# Patient Record
Sex: Female | Born: 1970 | Race: White | Hispanic: No | Marital: Married | Smoking: Former smoker
Health system: Southern US, Community
[De-identification: ages and names within clinical notes are randomized; demographics above are authoritative.]

---

## 2006-03-07 ENCOUNTER — Emergency Department (HOSPITAL_COMMUNITY): Admission: EM | Admit: 2006-03-07 | Discharge: 2006-03-07 | Payer: Self-pay | Admitting: Emergency Medicine

## 2006-08-09 ENCOUNTER — Emergency Department (HOSPITAL_COMMUNITY): Admission: EM | Admit: 2006-08-09 | Discharge: 2006-08-09 | Payer: Self-pay | Admitting: Family Medicine

## 2006-08-19 ENCOUNTER — Emergency Department (HOSPITAL_COMMUNITY): Admission: EM | Admit: 2006-08-19 | Discharge: 2006-08-19 | Payer: Self-pay | Admitting: Family Medicine

## 2008-01-16 ENCOUNTER — Emergency Department (HOSPITAL_COMMUNITY): Admission: EM | Admit: 2008-01-16 | Discharge: 2008-01-16 | Payer: Self-pay | Admitting: Emergency Medicine

## 2008-01-30 HISTORY — PX: CHOLECYSTECTOMY: SHX55

## 2008-04-29 ENCOUNTER — Emergency Department (HOSPITAL_COMMUNITY): Admission: EM | Admit: 2008-04-29 | Discharge: 2008-04-30 | Payer: Self-pay | Admitting: Emergency Medicine

## 2009-01-17 ENCOUNTER — Ambulatory Visit (HOSPITAL_COMMUNITY): Admission: RE | Admit: 2009-01-17 | Discharge: 2009-01-17 | Payer: Self-pay | Admitting: General Surgery

## 2010-05-01 LAB — CBC
HCT: 42.5 % (ref 36.0–46.0)
Hemoglobin: 14.4 g/dL (ref 12.0–15.0)
MCV: 93.3 fL (ref 78.0–100.0)
Platelets: 182 10*3/uL (ref 150–400)
RBC: 4.56 MIL/uL (ref 3.87–5.11)
RDW: 13 % (ref 11.5–15.5)

## 2010-05-10 LAB — URINALYSIS, ROUTINE W REFLEX MICROSCOPIC
Glucose, UA: NEGATIVE mg/dL
Hgb urine dipstick: NEGATIVE
Specific Gravity, Urine: 1.005 (ref 1.005–1.030)
Urobilinogen, UA: 0.2 mg/dL (ref 0.0–1.0)

## 2010-05-10 LAB — CBC
Hemoglobin: 14.9 g/dL (ref 12.0–15.0)
MCHC: 34.6 g/dL (ref 30.0–36.0)
MCV: 90.6 fL (ref 78.0–100.0)
Platelets: 171 10*3/uL (ref 150–400)
RDW: 13.4 % (ref 11.5–15.5)

## 2010-05-10 LAB — URINE MICROSCOPIC-ADD ON

## 2010-05-10 LAB — DIFFERENTIAL
Basophils Relative: 1 % (ref 0–1)
Lymphs Abs: 2.9 10*3/uL (ref 0.7–4.0)
Monocytes Relative: 7 % (ref 3–12)
Neutrophils Relative %: 55 % (ref 43–77)

## 2010-05-10 LAB — COMPREHENSIVE METABOLIC PANEL
BUN: 5 mg/dL — ABNORMAL LOW (ref 6–23)
Calcium: 9.1 mg/dL (ref 8.4–10.5)
Chloride: 105 mEq/L (ref 96–112)
Glucose, Bld: 73 mg/dL (ref 70–99)
Potassium: 4.1 mEq/L (ref 3.5–5.1)

## 2010-05-10 LAB — POCT PREGNANCY, URINE: Preg Test, Ur: NEGATIVE

## 2010-05-10 LAB — LIPASE, BLOOD: Lipase: 33 U/L (ref 11–59)

## 2010-06-28 ENCOUNTER — Other Ambulatory Visit: Payer: Self-pay | Admitting: Obstetrics and Gynecology

## 2010-11-03 LAB — CBC
HCT: 42 % (ref 36.0–46.0)
Platelets: 159 10*3/uL (ref 150–400)
RBC: 4.56 MIL/uL (ref 3.87–5.11)
WBC: 10.7 10*3/uL — ABNORMAL HIGH (ref 4.0–10.5)

## 2010-11-03 LAB — DIFFERENTIAL
Basophils Absolute: 0 10*3/uL (ref 0.0–0.1)
Basophils Relative: 0 % (ref 0–1)
Eosinophils Absolute: 0.2 10*3/uL (ref 0.0–0.7)
Lymphs Abs: 1.9 10*3/uL (ref 0.7–4.0)
Monocytes Relative: 5 % (ref 3–12)
Neutro Abs: 7.2 10*3/uL (ref 1.7–7.7)

## 2010-11-03 LAB — COMPREHENSIVE METABOLIC PANEL
ALT: 39 U/L — ABNORMAL HIGH (ref 0–35)
AST: 27 U/L (ref 0–37)
Alkaline Phosphatase: 71 U/L (ref 39–117)
CO2: 25 mEq/L (ref 19–32)
Sodium: 138 mEq/L (ref 135–145)

## 2010-11-03 LAB — URINALYSIS, ROUTINE W REFLEX MICROSCOPIC
Bilirubin Urine: NEGATIVE
Glucose, UA: NEGATIVE mg/dL
Hgb urine dipstick: NEGATIVE
Protein, ur: NEGATIVE mg/dL
Specific Gravity, Urine: 1.027 (ref 1.005–1.030)
Urobilinogen, UA: 0.2 mg/dL (ref 0.0–1.0)
pH: 6 (ref 5.0–8.0)

## 2012-05-20 ENCOUNTER — Encounter: Payer: Self-pay | Admitting: Gynecology

## 2012-06-10 ENCOUNTER — Encounter: Payer: Self-pay | Admitting: Gynecology

## 2012-06-17 ENCOUNTER — Telehealth: Payer: Self-pay | Admitting: Gynecology

## 2012-06-17 NOTE — Telephone Encounter (Signed)
Left message on CB home # of need to keep appt. Tomorrow with Dr. Farrel Gobble.

## 2012-06-17 NOTE — Telephone Encounter (Signed)
Patient would like to speak to nurse to find out if she will have any complications with her pap smear tomorrow during appointment after having unexpectedly began her menstrual cycle.

## 2012-06-18 ENCOUNTER — Ambulatory Visit (INDEPENDENT_AMBULATORY_CARE_PROVIDER_SITE_OTHER): Payer: 59 | Admitting: Gynecology

## 2012-06-18 ENCOUNTER — Encounter: Payer: Self-pay | Admitting: Gynecology

## 2012-06-18 VITALS — BP 104/66 | Ht 63.0 in | Wt 167.0 lb

## 2012-06-18 DIAGNOSIS — F17200 Nicotine dependence, unspecified, uncomplicated: Secondary | ICD-10-CM | POA: Insufficient documentation

## 2012-06-18 DIAGNOSIS — Z309 Encounter for contraceptive management, unspecified: Secondary | ICD-10-CM

## 2012-06-18 DIAGNOSIS — Z01419 Encounter for gynecological examination (general) (routine) without abnormal findings: Secondary | ICD-10-CM

## 2012-06-18 DIAGNOSIS — Z124 Encounter for screening for malignant neoplasm of cervix: Secondary | ICD-10-CM

## 2012-06-18 MED ORDER — MISOPROSTOL 200 MCG PO TABS: ORAL_TABLET | ORAL | Status: DC

## 2012-06-18 NOTE — Progress Notes (Signed)
42 y.o.   Married    Caucasian   female [id number]   here for annual exam.  Pt is not using contraception but is not interested in conception.  Had used ocp in past but d/c'd due to smoking. Pt denies dyspareunia.  Pt interested in quitting smoking tried cold [place] without success and was nauseated from vaporized nicotine  LMP: 06/17/12         Sexually active: yes  The current method of family planning is none.    Exercising: none Last mammogram:  2012 Last pap smear: 2012 History of abnormal pap: Age 84/20 - had a colposcopy been normal eversince Smoking: 1 pak/qd Alcohol: no Last colonoscopy: none Last Bone Density:  none Last tetanus shot: within 10 years Last cholesterol check: 05/21/12-  Normal BSE: yes  Hgb: PCP     Urine: PCP   No family history on file.  There are no active problems to display for this patient.   No past medical history on file.  No past surgical history on file.  Allergies: Review of patient's allergies indicates not on file.  No current outpatient prescriptions on file.   No current facility-administered medications for this visit.    ROS: Pertinent items are noted in HPI.  Social Hx:    Exam:    There were no vitals taken for this visit.   Wt Readings from Last 3 Encounters:  No data found for Wt     Ht Readings from Last 3 Encounters:  No data found for Ht    General appearance: alert, cooperative and appears stated age Head: Normocephalic, without obvious abnormality, atraumatic Neck: no adenopathy, supple, symmetrical, trachea midline and thyroid not enlarged, symmetric, no tenderness/mass/nodules Lungs: clear to auscultation bilaterally Breasts: Inspection negative, No nipple retraction or dimpling, No nipple discharge or bleeding, No axillary or supraclavicular adenopathy, Normal to palpation without dominant masses Heart: regular rate and rhythm Abdomen: soft, non-tender; bowel sounds normal; no masses,  no  organomegaly Extremities: extremities normal, atraumatic, no cyanosis or edema Skin: Skin color, texture, turgor normal. No rashes or lesions Lymph nodes: Cervical, supraclavicular, and axillary nodes normal. No abnormal inguinal nodes palpated Neurologic: Grossly normal   Pelvic: External genitalia:  no lesions              Urethra:  normal appearing urethra with no masses, tenderness or lesions              Bartholins and Skenes: normal                 Vagina: normal appearing vagina with normal color, no lesions, menstrum              Cervix: normal appearance, menstrum noted              Pap taken: yes        Bimanual Exam:  Uterus:  uterus is normal size, shape, consistency and nontender                                      Adnexa: normal adnexa in size, nontender and no masses                                      Rectovaginal: Confirms  Anus:  normal sphincter tone, no lesions  A: Annual exam-well women Contraceptive management smoker     P:discussed contraceptive options-IUD's nexpanon Depo-pt would like to have Mirena placed, instructed regarding pretreatment with cytotec  mammogram qyear pap smear guideline reviewed Long discussion regarding smoking cessation techniques, pt fearful regarding weight gain, discussed vaporized making her ill might be a preferable exchange as she doesn't like it, patches and other oral substitutes reviewed-ie gum, straws, etc return annually or prn     An After Visit Summary was printed and given to the patient.

## 2012-06-18 NOTE — Patient Instructions (Signed)
Levonorgestrel intrauterine device (IUD) What is this medicine? LEVONORGESTREL IUD (LEE voe nor jes trel) is a contraceptive (birth control) device. It is used to prevent pregnancy and to treat heavy bleeding that occurs during your period. It can be used for up to 5 years. This medicine may be used for other purposes; ask your health care provider or pharmacist if you have questions. What should I tell my health care provider before I take this medicine? They need to know if you have any of these conditions: -abnormal Pap smear -cancer of the breast, uterus, or cervix -diabetes -endometritis -genital or pelvic infection now or in the past -have more than one sexual partner or your partner has more than one partner -heart disease -history of an ectopic or tubal pregnancy -immune system problems -IUD in place -liver disease or tumor -problems with blood clots or take blood-thinners -use intravenous drugs -uterus of unusual shape -vaginal bleeding that has not been explained -an unusual or allergic reaction to levonorgestrel, other hormones, silicone, or polyethylene, medicines, foods, dyes, or preservatives -pregnant or trying to get pregnant -breast-feeding How should I use this medicine? This device is placed inside the uterus by a health care professional. Talk to your pediatrician regarding the use of this medicine in children. Special care may be needed. Overdosage: If you think you have taken too much of this medicine contact a poison control center or emergency room at once. NOTE: This medicine is only for you. Do not share this medicine with others. What if I miss a dose? This does not apply. What may interact with this medicine? Do not take this medicine with any of the following medications: -amprenavir -bosentan -fosamprenavir This medicine may also interact with the following medications: -aprepitant -barbiturate medicines for inducing sleep or treating  seizures -bexarotene -griseofulvin -medicines to treat seizures like carbamazepine, ethotoin, felbamate, oxcarbazepine, phenytoin, topiramate -modafinil -pioglitazone -rifabutin -rifampin -rifapentine -some medicines to treat HIV infection like atazanavir, indinavir, lopinavir, nelfinavir, tipranavir, ritonavir -St. John's wort -warfarin This list may not describe all possible interactions. Give your health care provider a list of all the medicines, herbs, non-prescription drugs, or dietary supplements you use. Also tell them if you smoke, drink alcohol, or use illegal drugs. Some items may interact with your medicine. What should I watch for while using this medicine? Visit your doctor or health care professional for regular check ups. See your doctor if you or your partner has sexual contact with others, becomes HIV positive, or gets a sexual transmitted disease. This product does not protect you against HIV infection (AIDS) or other sexually transmitted diseases. You can check the placement of the IUD yourself by reaching up to the top of your vagina with clean fingers to feel the threads. Do not pull on the threads. It is a good habit to check placement after each menstrual period. Call your doctor right away if you feel more of the IUD than just the threads or if you cannot feel the threads at all. The IUD may come out by itself. You may become pregnant if the device comes out. If you notice that the IUD has come out use a backup birth control method like condoms and call your health care provider. Using tampons will not change the position of the IUD and are okay to use during your period. What side effects may I notice from receiving this medicine? Side effects that you should report to your doctor or health care professional as soon as possible: -allergic reactions   like skin rash, itching or hives, swelling of the face, lips, or tongue -fever, flu-like symptoms -genital sores -high  blood pressure -no menstrual period for 6 weeks during use -pain, swelling, warmth in the leg -pelvic pain or tenderness -severe or sudden headache -signs of pregnancy -stomach cramping -sudden shortness of breath -trouble with balance, talking, or walking -unusual vaginal bleeding, discharge -yellowing of the eyes or skin Side effects that usually do not require medical attention (report to your doctor or health care professional if they continue or are bothersome): -acne -breast pain -change in sex drive or performance -changes in weight -cramping, dizziness, or faintness while the device is being inserted -headache -irregular menstrual bleeding within first 3 to 6 months of use -nausea This list may not describe all possible side effects. Call your doctor for medical advice about side effects. You may report side effects to FDA at 1-800-FDA-1088. Where should I keep my medicine? This does not apply. NOTE: This sheet is a summary. It may not cover all possible information. If you have questions about this medicine, talk to your doctor, pharmacist, or health care provider.  2012, Elsevier/Gold Standard. (02/06/2008 6:39:08 PM)Smoking Cessation Quitting smoking is important to your health and has many advantages. However, it is not always easy to quit since nicotine is a very addictive drug. Often times, people try 3 times or more before being able to quit. This document explains the best ways for you to prepare to quit smoking. Quitting takes hard work and a lot of effort, but you can do it. ADVANTAGES OF QUITTING SMOKING  You will live longer, feel better, and live better.  Your body will feel the impact of quitting smoking almost immediately.  Within 20 minutes, blood pressure decreases. Your pulse returns to its normal level.  After 8 hours, carbon monoxide levels in the blood return to normal. Your oxygen level increases.  After 24 hours, the chance of having a heart attack  starts to decrease. Your breath, hair, and body stop smelling like smoke.  After 48 hours, damaged nerve endings begin to recover. Your sense of taste and smell improve.  After 72 hours, the body is virtually free of nicotine. Your bronchial tubes relax and breathing becomes easier.  After 2 to 12 weeks, lungs can hold more air. Exercise becomes easier and circulation improves.  The risk of having a heart attack, stroke, cancer, or lung disease is greatly reduced.  After 1 year, the risk of coronary heart disease is cut in half.  After 5 years, the risk of stroke falls to the same as a nonsmoker.  After 10 years, the risk of lung cancer is cut in half and the risk of other cancers decreases significantly.  After 15 years, the risk of coronary heart disease drops, usually to the level of a nonsmoker.  If you are pregnant, quitting smoking will improve your chances of having a healthy baby.  The people you live with, especially any children, will be healthier.  You will have extra money to spend on things other than cigarettes. QUESTIONS TO THINK ABOUT BEFORE ATTEMPTING TO QUIT You may want to talk about your answers with your caregiver.  Why do you want to quit?  If you tried to quit in the past, what helped and what did not?  What will be the most difficult situations for you after you quit? How will you plan to handle them?  Who can help you through the tough times? Your family? Friends? A  caregiver?  What pleasures do you get from smoking? What ways can you still get pleasure if you quit? Here are some questions to ask your caregiver:  How can you help me to be successful at quitting?  What medicine do you think would be best for me and how should I take it?  What should I do if I need more help?  What is smoking withdrawal like? How can I get information on withdrawal? GET READY  Set a quit date.  Change your environment by getting rid of all cigarettes, ashtrays,  matches, and lighters in your home, car, or work. Do not let people smoke in your home.  Review your past attempts to quit. Think about what worked and what did not. GET SUPPORT AND ENCOURAGEMENT You have a better chance of being successful if you have help. You can get support in many ways.  Tell your family, friends, and co-workers that you are going to quit and need their support. Ask them not to smoke around you.  Get individual, group, or telephone counseling and support. Programs are available at [employer] and health centers. Call your local health department for information about programs in your area.  Spiritual beliefs and practices may help some smokers quit.  Download a "quit meter" on your computer to keep track of quit statistics, such as how long you have gone without smoking, cigarettes not smoked, and money saved.  Get a self-help book about quitting smoking and staying off of tobacco. LEARN NEW SKILLS AND BEHAVIORS  Distract yourself from urges to smoke. Talk to someone, go for a walk, or occupy your time with a task.  Change your normal routine. Take a different route to work. Drink tea instead of coffee. Eat breakfast in a different place.  Reduce your stress. Take a hot bath, exercise, or read a book.  Plan something enjoyable to do every day. Reward yourself for not smoking.  Explore interactive web-based programs that specialize in helping you quit. GET MEDICINE AND USE IT CORRECTLY Medicines can help you stop smoking and decrease the urge to smoke. Combining medicine with the above behavioral methods and support can greatly increase your chances of successfully quitting smoking.  Nicotine replacement therapy helps deliver nicotine to your body without the negative effects and risks of smoking. Nicotine replacement therapy includes nicotine gum, lozenges, inhalers, nasal sprays, and skin patches. Some may be available over-the-counter and others require a  prescription.  Antidepressant medicine helps people abstain from smoking, but how this works is unknown. This medicine is available by prescription.  Nicotinic receptor partial agonist medicine simulates the effect of nicotine in your brain. This medicine is available by prescription. Ask your caregiver for advice about which medicines to use and how to use them based on your health history. Your caregiver will tell you what side effects to look out for if you choose to be on a medicine or therapy. Carefully read the information on the package. Do not use any other product containing nicotine while using a nicotine replacement product.  RELAPSE OR DIFFICULT SITUATIONS Most relapses occur within the first 3 months after quitting. Do not be discouraged if you start smoking again. Remember, most people try several times before finally quitting. You may have symptoms of withdrawal because your body is used to nicotine. You may crave cigarettes, be irritable, feel very hungry, cough often, get headaches, or have difficulty concentrating. The withdrawal symptoms are only temporary. They are strongest when you first quit,  but they will go away within 10 14 days. To reduce the chances of relapse, try to:  Avoid drinking alcohol. Drinking lowers your chances of successfully quitting.  Reduce the amount of caffeine you consume. Once you quit smoking, the amount of caffeine in your body increases and can give you symptoms, such as a rapid heartbeat, sweating, and anxiety.  Avoid smokers because they can make you want to smoke.  Do not let weight gain distract you. Many smokers will gain weight when they quit, usually less than 10 pounds. Eat a healthy diet and stay active. You can always lose the weight gained after you quit.  Find ways to improve your mood other than smoking. FOR MORE INFORMATION  www.smokefree.gov  Document Released: 01/09/2001 Document Revised: 07/17/2011 Document Reviewed:  04/26/2011 [facility] Patient Information 2014 [place], [place].

## 2012-06-19 ENCOUNTER — Telehealth: Payer: Self-pay | Admitting: Gynecology

## 2012-06-19 NOTE — Telephone Encounter (Signed)
LVM advising 100% covered and to call back during the first 5 days of her cycle.

## 2012-06-20 ENCOUNTER — Ambulatory Visit (INDEPENDENT_AMBULATORY_CARE_PROVIDER_SITE_OTHER): Payer: 59 | Admitting: Gynecology

## 2012-06-20 ENCOUNTER — Other Ambulatory Visit: Payer: Self-pay

## 2012-06-20 VITALS — BP 124/70 | HR 74 | Ht 63.0 in | Wt 166.6 lb

## 2012-06-20 DIAGNOSIS — Z1231 Encounter for screening mammogram for malignant neoplasm of breast: Secondary | ICD-10-CM

## 2012-06-20 DIAGNOSIS — Z309 Encounter for contraceptive management, unspecified: Secondary | ICD-10-CM

## 2012-06-20 LAB — IPS PAP TEST WITH HPV

## 2012-06-20 NOTE — Patient Instructions (Signed)
Intrauterine Device Insertion Care After Refer to this sheet in the next few weeks. These instructions provide you with information on caring for yourself after your procedure. Your caregiver may also give you more specific instructions. Your treatment has been planned according to current medical practices, but problems sometimes occur. Call your caregiver if you have any problems or questions after your procedure. HOME CARE INSTRUCTIONS   Only take over-the-counter or prescription medicines for pain, discomfort, or fever as directed by your caregiver. Do not use aspirin. This may increase bleeding.  Check your IUD to make sure it is in place before you resume sexual activity. You should be able to feel the strings. If you cannot feel the strings, something may be wrong. The IUD may have fallen out of the uterus, or the uterus may have been punctured (perforated) during placement. Also, if the strings are getting longer, it may mean that the IUD is being forced out of the uterus. You no longer have full protection from pregnancy if any of these problems occur.  You may resume sexual intercourse if you are not having problems with the IUD. The IUD is considered immediately effective.  You may resume normal activities.  Keep all follow-up appointments to be sure your IUD has remained in place. After the first exam, yearly exams are advised, unless you cannot feel the strings of your IUD.  Continue to check that the IUD is still in place by feeling for the strings after every menstrual period. SEEK MEDICAL CARE IF:   You have bleeding that is heavier or lasts longer than a normal menstrual cycle.  You have a fever.  You have increasing cramps or abdominal pain not relieved with medicine.  You have abdominal pain that does not seem to be related to the same area of earlier cramping and pain.  You are lightheaded, unusually weak, or faint.  You have abnormal vaginal discharge or  smells.  You have pain during sexual intercourse.  You cannot feel the IUD strings, or the IUD string has gotten longer.  You feel the IUD at the opening of the cervix in the vagina.  You think you are pregnant, or you miss your menstrual period.  The IUD string is hurting your sex partner. Document Released: 09/13/2010 Document Revised: 04/09/2011 Document Reviewed: 09/13/2010 [employer] Patient Information 2014 [employer].  

## 2012-06-20 NOTE — Telephone Encounter (Signed)
Patient is ready to schedule IUD appointment. Patient says she was told she could get this done today.

## 2012-06-20 NOTE — Telephone Encounter (Signed)
Spoke with pt about appt. Per TL, pt can come at 11:00 for Mirena insertion. Instructed pt to take 800 mg ibuprofen with food now. Pt agreeable.

## 2012-06-20 NOTE — Procedures (Signed)
38 yrsMarried Caucasian female presents for  insertion of Mirena. Denies any vaginal symptoms or STD concerns.  [facility]  Patient read information regarding IUD insertion.  All questions addressed.    Healthy female,time, place and personnormal menses, no abnormal bleeding, pelvic pain or discharge, no breast pain or new or enlarging lumps on self exam Abdomen: soft, non-tender Gro[place] inguinal nodes palpated  Pelvic exam: Vulva;normal female genitalia  Vagina:normal vagina  Cervix:Non-tender, Negative CMT, no lesions or redness, nulliparous/parous os  Uterus:normal shape, position and consistency, anteverted    Procedure:  Speculum inserted into vagina. Cervix visualized and cleansed with betadine solution X 3. Tenaculum placed on cervix at 12 o'clock position(s).  Uterus sounded to 7 centimeters.  IUD removed from sterile packet and under sterile conditions inserted to fundus of uterus.  Introducer removed without difficulty.  IUD string trimmed to 3 centimeters.  Remainder string given to patient to feel for identification.  Tenaculum removed.  No bleeding noted.  Speculum removed.  Uterus palpated normal.  Patient tolerated procedure well.  A: Insertion of Mirena, Lot # [id number], Expiration date 12/15   P:  Instructions and warnings signs given.       IUD identification card given with IUD removal 05/2017       Return visit 1 MONTH

## 2012-06-24 ENCOUNTER — Ambulatory Visit: Payer: Self-pay | Admitting: Gynecology

## 2012-06-25 ENCOUNTER — Ambulatory Visit
Admission: RE | Admit: 2012-06-25 | Discharge: 2012-06-25 | Disposition: A | Discharge status: Home / Self Care | Payer: 59 | Source: Ambulatory Visit

## 2012-06-25 DIAGNOSIS — Z1231 Encounter for screening mammogram for malignant neoplasm of breast: Secondary | ICD-10-CM

## 2012-10-05 ENCOUNTER — Ambulatory Visit: Payer: 59 | Admitting: Family Medicine

## 2012-10-05 VITALS — BP 110/64 | HR 74 | Temp 98.1°F | Resp 16 | Ht 63.0 in | Wt 166.6 lb

## 2012-10-05 DIAGNOSIS — R05 Cough: Secondary | ICD-10-CM

## 2012-10-05 DIAGNOSIS — J209 Acute bronchitis, unspecified: Secondary | ICD-10-CM

## 2012-10-05 MED ORDER — AZITHROMYCIN 250 MG PO TABS: ORAL_TABLET | ORAL | Status: DC

## 2012-10-05 MED ORDER — FLUCONAZOLE 150 MG PO TABS: 150.0000 mg | ORAL_TABLET | Freq: Once | ORAL | Status: DC

## 2012-10-05 MED ORDER — HYDROCODONE-HOMATROPINE 5-1.5 MG/5ML PO SYRP: 5.0000 mL | ORAL_SOLUTION | Freq: Three times a day (TID) | ORAL | Status: DC | PRN

## 2012-10-05 NOTE — Progress Notes (Signed)
[facility] [address] [place] [address] [phone]  Date:  10/05/2012   Name:  Carmen Chaney   DOB:  09/04/1970   MRN:  [id number]  PCP:  Leonel Ramsay, MD    Chief Complaint: Nasal Congestion, Sinus pressure, Chills and Cough   History of Present Illness:  Carmen Chaney is a 42 y.o. very pleasant female patient who presents with the following:  Here today with illness.  She notes sinus and chest congestion for about one week.  The sx started with chest burning and feeling tight.  The cough is productive.  She is blowing out a lot of nasal mucus as well.  She is most bothered by her chest congestion and cough.  Some sneezing, mostly cough.   She did have a temp of 101.3 a few days ago, none more recently.   No GI symptoms.   She is generally healthy She has an IUD, no menses.   Her step- daughter was recently ill with similar sx.   She has not taken any meds today except for cough drops   Patient Active Problem List   Diagnosis Date Noted  . Smoker 06/18/2012    History reviewed. No pertinent past medical history.  Past Surgical History  Procedure Laterality Date  . Cholecystectomy  2010    History  Substance Use Topics  . Smoking status: Current Every Day Smoker -- 1.00 packs/day for 15 years    Types: Cigarettes  . Smokeless tobacco: Not on file  . Alcohol Use: No    Family History  Problem Relation Age of Onset  . Breast cancer Mother   . Hypertension Mother   . Prostate cancer Father   . Hypertension Brother   . Hypertension Maternal Grandmother   . Hypertension Maternal Grandfather   . Breast cancer Paternal Grandmother   . Diabetes Paternal Grandmother     No Known Allergies  Medication list has been reviewed and updated.  Current Outpatient Prescriptions on File Prior to Visit  Medication Sig Dispense Refill  . Cetirizine-Pseudoephedrine (ZYRTEC-D PO) Take by mouth.      . misoprostol (CYTOTEC) 200 MCG tablet 1 po  evening before procedure and morning of  2 tablet  0   No current facility-administered medications on file prior to visit.    Review of Systems:  As per HPI- otherwise negative.   Physical Examination: Filed Vitals:   10/05/12 0944  BP: 110/64  Pulse: 74  Temp: 98.1 F (36.7 C)  Resp: 16   Filed Vitals:   10/05/12 0944  Height: 5\' 3"  (1.6 m)  Weight: 166 lb 9.6 oz (75.569 kg)   Body mass index is 29.52 kg/(m^2). Ideal Body Weight: Weight in (lb) to have BMI = 25: 140.8  GEN: WDWN, NAD, Non-toxic, A & O x 3, overweight HEENT: Atraumatic, Normocephalic. Neck supple. No masses, No LAD.  Bilateral TM wnl, oropharynx normal.  PEERL,EOMI.   Nasal congestion, appears congested  Ears and Nose: No external deformity. CV: RRR, No M/G/R. No JVD. No thrill. No extra heart sounds. PULM: CTA B, no wheezes, crackles, rhonchi. No retractions. No resp. distress. No accessory muscle use. EXTR: No c/c/e NEURO Normal gait.  PSYCH: Normally interactive. Conversant. Not depressed or anxious appearing.  Calm demeanor.    Assessment and Plan: Acute bronchitis - Plan: azithromycin (ZITHROMAX Z-PAK) 250 MG tablet, fluconazole (DIFLUCAN) 150 MG tablet  Cough - Plan: HYDROcodone-homatropine (HYCODAN) 5-1.5 MG/5ML syrup   Given  note for tonight and tomorrow night for her job.  Had to hand write due to printer problem.   Azithromycin, diflucan is needed for yeast vaginitis.  Hycodan as needed but cautioned regarding sedation.   See patient instructions for more details.     Signed Abbe Amsterdam, MD

## 2012-10-05 NOTE — Patient Instructions (Addendum)
Use the azithromycin as directed, and the cough syrup as needed. Let me know if you are not better in the next few days- Sooner if worse.

## 2013-01-25 ENCOUNTER — Ambulatory Visit: Payer: Self-pay | Admitting: Family Medicine

## 2013-01-25 ENCOUNTER — Emergency Department (HOSPITAL_COMMUNITY)
Admission: EM | Admit: 2013-01-25 | Discharge: 2013-01-25 | Disposition: A | Discharge status: Home / Self Care | Payer: 59 | Attending: Emergency Medicine | Admitting: Emergency Medicine

## 2013-01-25 ENCOUNTER — Emergency Department (HOSPITAL_COMMUNITY): Payer: 59

## 2013-01-25 ENCOUNTER — Other Ambulatory Visit: Payer: Self-pay

## 2013-01-25 ENCOUNTER — Ambulatory Visit: Payer: Self-pay

## 2013-01-25 ENCOUNTER — Encounter (HOSPITAL_COMMUNITY): Payer: Self-pay | Admitting: Emergency Medicine

## 2013-01-25 VITALS — BP 102/82 | HR 70 | Temp 98.0°F | Resp 18 | Wt 172.0 lb

## 2013-01-25 DIAGNOSIS — R0789 Other chest pain: Secondary | ICD-10-CM

## 2013-01-25 DIAGNOSIS — R05 Cough: Secondary | ICD-10-CM

## 2013-01-25 DIAGNOSIS — R0602 Shortness of breath: Secondary | ICD-10-CM

## 2013-01-25 DIAGNOSIS — J209 Acute bronchitis, unspecified: Secondary | ICD-10-CM | POA: Insufficient documentation

## 2013-01-25 DIAGNOSIS — Z3202 Encounter for pregnancy test, result negative: Secondary | ICD-10-CM | POA: Insufficient documentation

## 2013-01-25 DIAGNOSIS — Z792 Long term (current) use of antibiotics: Secondary | ICD-10-CM | POA: Insufficient documentation

## 2013-01-25 DIAGNOSIS — J40 Bronchitis, not specified as acute or chronic: Secondary | ICD-10-CM

## 2013-01-25 DIAGNOSIS — Z87891 Personal history of nicotine dependence: Secondary | ICD-10-CM | POA: Insufficient documentation

## 2013-01-25 DIAGNOSIS — IMO0002 Reserved for concepts with insufficient information to code with codable children: Secondary | ICD-10-CM | POA: Insufficient documentation

## 2013-01-25 LAB — CBC
HCT: 42.5 % (ref 36.0–46.0)
Hemoglobin: 14.6 g/dL (ref 12.0–15.0)
MCHC: 34.4 g/dL (ref 30.0–36.0)
RBC: 4.64 MIL/uL (ref 3.87–5.11)
WBC: 5.8 10*3/uL (ref 4.0–10.5)

## 2013-01-25 LAB — URINALYSIS, ROUTINE W REFLEX MICROSCOPIC
Bilirubin Urine: NEGATIVE
Glucose, UA: 250 mg/dL — AB
Ketones, ur: 15 mg/dL — AB
Leukocytes, UA: NEGATIVE
Nitrite: NEGATIVE
Specific Gravity, Urine: 1.028 (ref 1.005–1.030)
Urobilinogen, UA: 0.2 mg/dL (ref 0.0–1.0)
pH: 7 (ref 5.0–8.0)

## 2013-01-25 LAB — BASIC METABOLIC PANEL
BUN: 8 mg/dL (ref 6–23)
CO2: 21 mEq/L (ref 19–32)
Chloride: 104 mEq/L (ref 96–112)
GFR calc non Af Amer: 72 mL/min — ABNORMAL LOW (ref >90)
Glucose, Bld: 203 mg/dL — ABNORMAL HIGH (ref 70–99)
Potassium: 3.4 mEq/L — ABNORMAL LOW (ref 3.5–5.1)
Sodium: 139 mEq/L (ref 135–145)

## 2013-01-25 LAB — PREGNANCY, URINE: Preg Test, Ur: NEGATIVE

## 2013-01-25 LAB — POCT CBC
Hemoglobin: 13.5 g/dL (ref 12.2–16.2)
MCH, POC: 29.9 pg (ref 27–31.2)
MCV: 96.8 fL (ref 80–97)
POC Granulocyte: 3.3 (ref 2–6.9)
POC MID %: 8.4 %M (ref 0–12)
Platelet Count, POC: 146 10*3/uL (ref 142–424)
WBC: 7.1 10*3/uL (ref 4.6–10.2)

## 2013-01-25 LAB — POCT I-STAT TROPONIN I
Troponin i, poc: 0 ng/mL (ref 0.00–0.08)
Troponin i, poc: 0 ng/mL (ref 0.00–0.08)

## 2013-01-25 MED ORDER — IPRATROPIUM BROMIDE 0.02 % IN SOLN
0.5000 mg | Freq: Once | RESPIRATORY_TRACT | Status: AC
  Administered 2013-01-25: 0.5 mg via RESPIRATORY_TRACT
  Filled 2013-01-25: qty 2.5

## 2013-01-25 MED ORDER — METHYLPREDNISOLONE SODIUM SUCC 125 MG IJ SOLR
125.0000 mg | Freq: Once | INTRAMUSCULAR | Status: AC
  Administered 2013-01-25: 125 mg via INTRAMUSCULAR

## 2013-01-25 MED ORDER — SODIUM CHLORIDE 0.9 % IV SOLN
INTRAVENOUS | Status: DC
  Administered 2013-01-25: 19:00:00 via INTRAVENOUS

## 2013-01-25 MED ORDER — IPRATROPIUM BROMIDE 0.02 % IN SOLN
0.5000 mg | Freq: Once | RESPIRATORY_TRACT | Status: AC
  Administered 2013-01-25: 0.5 mg via RESPIRATORY_TRACT

## 2013-01-25 MED ORDER — PREDNISONE 10 MG PO TABS: ORAL_TABLET | ORAL | Status: DC

## 2013-01-25 MED ORDER — ALBUTEROL SULFATE HFA 108 (90 BASE) MCG/ACT IN AERS
2.0000 | INHALATION_SPRAY | RESPIRATORY_TRACT | Status: AC
  Administered 2013-01-25: 2 via RESPIRATORY_TRACT
  Filled 2013-01-25: qty 6.7

## 2013-01-25 MED ORDER — ALBUTEROL SULFATE (5 MG/ML) 0.5% IN NEBU
5.0000 mg | INHALATION_SOLUTION | Freq: Once | RESPIRATORY_TRACT | Status: AC
  Administered 2013-01-25: 5 mg via RESPIRATORY_TRACT
  Filled 2013-01-25: qty 1

## 2013-01-25 MED ORDER — MORPHINE SULFATE 4 MG/ML IJ SOLN
4.0000 mg | INTRAMUSCULAR | Status: DC | PRN
  Administered 2013-01-25: 4 mg via INTRAVENOUS
  Filled 2013-01-25: qty 1

## 2013-01-25 MED ORDER — HYDROCODONE-HOMATROPINE 5-1.5 MG/5ML PO SYRP: 5.0000 mL | ORAL_SOLUTION | Freq: Three times a day (TID) | ORAL | Status: DC | PRN

## 2013-01-25 MED ORDER — ALBUTEROL SULFATE (2.5 MG/3ML) 0.083% IN NEBU
2.5000 mg | INHALATION_SOLUTION | Freq: Once | RESPIRATORY_TRACT | Status: AC
  Administered 2013-01-25: 2.5 mg via RESPIRATORY_TRACT

## 2013-01-25 MED ORDER — IOHEXOL 350 MG/ML SOLN
80.0000 mL | Freq: Once | INTRAVENOUS | Status: AC | PRN
  Administered 2013-01-25: 67 mL via INTRAVENOUS

## 2013-01-25 MED ORDER — DIPHENHYDRAMINE HCL 50 MG/ML IJ SOLN
25.0000 mg | Freq: Once | INTRAMUSCULAR | Status: AC
  Administered 2013-01-25: 25 mg via INTRAVENOUS
  Filled 2013-01-25: qty 1

## 2013-01-25 MED ORDER — HYDROCODONE-ACETAMINOPHEN 5-325 MG PO TABS: ORAL_TABLET | ORAL | Status: DC

## 2013-01-25 MED ORDER — AZITHROMYCIN 250 MG PO TABS: ORAL_TABLET | ORAL | Status: DC

## 2013-01-25 NOTE — ED Provider Notes (Signed)
CSN: [id number]     Arrival date & time 01/25/13  1551 History   First MD Initiated Contact with Patient 01/25/13 1808     Chief Complaint  Patient presents with  . Chest Pain  . Shortness of Breath    HPI Pt was seen at 1815. Per pt, c/o gradual onset and persistence of constant cough and SOB for the past 2 days. States the SOB worsens on exertion. Has been associated with generalized chest "tightness" which radiates into her left shoulder since yesterday. Pt states she was dx with "the flu" approx 1 week ago and is taking tamiflu. States her previous symptoms of runny/stuffy nose and sinus congestion have resolved, but she continues to have cough and sneezing. States she "tried an inhaler" without relief of her SOB and chest tightness. States she was evaluated at an [facility] today, dx "bronchitis," rx z-pack and given IM solumedrol. States she was also given a neb treatment without relief of her symptoms. Pt was then sent to the ED "to make sure I didn't have a blood clot in my lungs." Denies sore throat, no fevers, no palpitations, no abd pain, no N/V/D, no rash.       History reviewed. No pertinent past medical history.    Past Surgical History  Procedure Laterality Date  . Cholecystectomy  2010   Family History  Problem Relation Age of Onset  . Breast cancer Mother   . Hypertension Mother   . Prostate cancer Father   . Hypertension Brother   . Hypertension Maternal Grandmother   . Hypertension Maternal Grandfather   . Breast cancer Paternal Grandmother   . Diabetes Paternal Grandmother    History  Substance Use Topics  . Smoking status: Former Smoker -- 1.00 packs/day for 15 years    Types: Cigarettes  . Smokeless tobacco: Not on file  . Alcohol Use: No   OB History   Grav Para Term Preterm Abortions TAB SAB Ect Mult Living   2  0  2  2   0     Review of Systems ROS: Statement: All systems negative except as marked or noted in the HPI; Constitutional: Negative for fever  and chills. ; ; Eyes: Negative for eye pain, redness and discharge. ; ; ENMT: +sneezing. Negative for ear pain, hoarseness, nasal congestion, sinus pressure and sore throat. ; ; Cardiovascular: +CP, SOB. Negative for palpitations, diaphoresis, and peripheral edema. ; ; Respiratory: +cough. Negative for wheezing and stridor. ; ; Gastrointestinal: Negative for nausea, vomiting, diarrhea, abdominal pain, blood in stool, hematemesis, jaundice and rectal bleeding. . ; ; Genitourinary: Negative for dysuria, flank pain and hematuria. ; ; Musculoskeletal: Negative for back pain and neck pain. Negative for swelling and trauma.; ; Skin: Negative for pruritus, rash, abrasions, blisters, bruising and skin lesion.; ; Neuro: Negative for headache, lightheadedness and neck stiffness. Negative for weakness, altered level of consciousness , altered mental status, extremity weakness, paresthesias, involuntary movement, seizure and syncope.      Allergies  Review of patient's allergies indicates no known allergies.  Home Medications   Current Outpatient Rx  Name  Route  Sig  Dispense  Refill  . azithromycin (ZITHROMAX Z-PAK) 250 MG tablet      Use as a zpack   6 each   0   . Cetirizine-Pseudoephedrine (ZYRTEC-D PO)   Oral   Take by mouth.         Marland Kitchen HYDROcodone-homatropine (HYCODAN) 5-1.5 MG/5ML syrup   Oral   Take 5  mLs by mouth every 8 (eight) hours as needed for cough.   90 mL   0   . oseltamivir (TAMIFLU) 75 MG capsule   Oral   Take 75 mg by mouth.         . predniSONE (DELTASONE) 10 MG tablet      Take 6 tabs po d1, 5 tabs po d2, 4 tabs po d3, 3 tabs po d4, 2 tabs po d5, 1 tab po d6.   21 tablet   0    BP 141/83  Pulse 79  Temp(Src) 97.3 F (36.3 C) (Oral)  Resp 14  Ht 5\' 3"  (1.6 m)  Wt 165 lb (74.844 kg)  BMI 29.24 kg/m2  SpO2 97% Physical Exam 1820: Physical examination:  Nursing notes reviewed; Vital signs and O2 SAT reviewed;  Constitutional: Well developed, Well nourished,  Well hydrated, In no acute distress; Head:  Normocephalic, atraumatic; Eyes: EOMI, PERRL, No scleral icterus; ENMT: TM's clear bilat. +edemetous nasal turbinates bilat with clear rhinorrhea. Mouth and pharynx normal, Mucous membranes moist; Neck: Supple, Full range of motion, No lymphadenopathy; Cardiovascular: Tachycardic rate and rhythm, No gallop; Respiratory: Breath sounds diminished & equal bilaterally, No wheezes. No coughing. Speaking full sentences with ease, Normal respiratory effort/excursion; Chest: Nontender, Movement normal; Abdomen: Soft, Nontender, Nondistended, Normal bowel sounds; Genitourinary: No CVA tenderness; Extremities: Pulses normal, No tenderness, No edema, No calf edema or asymmetry.; Neuro: AA&Ox3, Major CN grossly intact.  Speech clear. No gross focal motor or sensory deficits in extremities.; Skin: Color normal, Warm, Dry.; Psych:  Affect flat, poor eye contact.     ED Course  Procedures     EKG Interpretation    Date/Time:  Sunday January 25 2013 16:02:50 EST Ventricular Rate:  113 PR Interval:  130 QRS Duration: 68 QT Interval:  308 QTC Calculation: 422 R Axis:   90 Text Interpretation:  Sinus tachycardia Rightward axis Nonspecific ST and T wave abnormality Abnormal ECG When compared with ECG of 03/07/2006 Nonspecific ST and T wave abnormality is now Present Confirmed by [facility]  MD, Nicholos Johns [phone]) on 01/25/2013 6:12:32 PM            MDM  MDM Reviewed: previous chart, nursing note and vitals Reviewed previous: labs and ECG Interpretation: labs, ECG, x-ray and CT scan    Date: 01/25/2013 repeat EKG  Rate: 78  Rhythm: normal sinus rhythm, baseline wander  QRS Axis: normal  Intervals: normal  ST/T Wave abnormalities: nonspecific ST/T changes  Conduction Disutrbances:none  Narrative Interpretation:   Old EKG Reviewed: unchanged; no significant changes from previous EKG 03/07/2006.  Results for orders placed during the hospital encounter of  01/25/13  CBC      Result Value Range   WBC 5.8  4.0 - 10.5 K/uL   RBC 4.64  3.87 - 5.11 MIL/uL   Hemoglobin 14.6  12.0 - 15.0 g/dL   HCT 95.6  21.3 - 08.6 %   MCV 91.6  78.0 - 100.0 fL   MCH 31.5  26.0 - 34.0 pg   MCHC 34.4  30.0 - 36.0 g/dL   RDW 57.8  46.9 - 62.9 %   Platelets 160  150 - 400 K/uL  BASIC METABOLIC PANEL      Result Value Range   Sodium 139  135 - 145 mEq/L   Potassium 3.4 (*) 3.5 - 5.1 mEq/L   Chloride 104  96 - 112 mEq/L   CO2 21  19 - 32 mEq/L   Glucose, Bld 203 (*)  70 - 99 mg/dL   BUN 8  6 - 23 mg/dL   Creatinine, Ser 1.61  0.50 - 1.10 mg/dL   Calcium 9.2  8.4 - 09.6 mg/dL   GFR calc non Af Amer 72 (*) >90 mL/min   GFR calc Af Amer 83 (*) >90 mL/min  PRO B NATRIURETIC PEPTIDE      Result Value Range   Pro B Natriuretic peptide (BNP) 16.3  0 - 125 pg/mL  URINALYSIS, ROUTINE W REFLEX MICROSCOPIC      Result Value Range   Color, Urine YELLOW  YELLOW   APPearance CLEAR  CLEAR   Specific Gravity, Urine 1.028  1.005 - 1.030   pH 7.0  5.0 - 8.0   Glucose, UA 250 (*) NEGATIVE mg/dL   Hgb urine dipstick NEGATIVE  NEGATIVE   Bilirubin Urine NEGATIVE  NEGATIVE   Ketones, ur 15 (*) NEGATIVE mg/dL   Protein, ur NEGATIVE  NEGATIVE mg/dL   Urobilinogen, UA 0.2  0.0 - 1.0 mg/dL   Nitrite NEGATIVE  NEGATIVE   Leukocytes, UA NEGATIVE  NEGATIVE  PREGNANCY, URINE      Result Value Range   Preg Test, Ur NEGATIVE  NEGATIVE  POCT I-STAT TROPONIN I      Result Value Range   Troponin i, poc 0.00  0.00 - 0.08 ng/mL   Comment 3           POCT I-STAT TROPONIN I      Result Value Range   Troponin i, poc 0.00  0.00 - 0.08 ng/mL   Comment 3            Dg Chest 2 View 01/25/2013   CLINICAL DATA:  Low grade fever.  Short of breath.  Chest tightness  EXAM: CHEST  2 VIEW  COMPARISON:  01/25/2013 at 7:43 a.m.  FINDINGS: The heart size and mediastinal contours are within normal limits. Both lungs are clear. The visualized skeletal structures are unremarkable.  IMPRESSION:  Normal chest radiographs   Electronically Signed   By: Amie Portland M.D.   On: 01/25/2013 16:45    Ct Angio Chest Pe W/cm &/or Wo Cm 01/25/2013   CLINICAL DATA:  One day history of shortness of breath, mid chest pain and tightness, radiating into the left shoulder.  EXAM: CT ANGIOGRAPHY CHEST WITH CONTRAST  TECHNIQUE: Multidetector CT imaging of the chest was performed using the standard protocol during bolus administration of intravenous contrast. Multiplanar CT image reconstructions including MIPs were obtained to evaluate the vascular anatomy.  CONTRAST:  67mL OMNIPAQUE IOHEXOL 350 MG/ML IV.  COMPARISON:  None.  FINDINGS: Contrast opacification of the pulmonary arteries is good. No filling defects within either main pulmonary artery or their branches in either lung to suggest pulmonary embolism. Heart size normal. No visible coronary atherosclerosis. No visible atherosclerosis involving the thoracic or upper abdominal aorta or their visualized branches.  Low lung volumes accounting for atelectasis in the lower lobes. Lungs otherwise clear without localized airspace consolidation, interstitial disease, or parenchymal nodules or masses. No pleural effusions. Central airways patent without significant bronchial wall thickening.  No significant mediastinal, hilar, or axillary lymphadenopathy. Visualized thyroid gland unremarkable.  Approximate 1.8 x 2.2 cm likely complex sebaceous cyst in the subcutaneous fat of the upper back at the approximate T1 level just to the right of midline. Bone window images demonstrate mild thoracic spondylosis. Visualized upper abdomen unremarkable for the early arterial phase of enhancement; there is an anatomic variant in that the left lobe of  the liver extends well across the midline into the left upper quadrant.  Review of the MIP images confirms the above findings.  IMPRESSION: 1. No evidence of pulmonary embolism. 2. Suboptimal inspiration which accounts for mild atelectasis  in the lower lobes. No acute cardiopulmonary disease otherwise. 3. Approximate 2 cm likely sebaceous cyst in the subcutaneous fat of the upper back just to the right of midline at the T1 level.   Electronically Signed   By: Hulan Saas M.D.   On: 01/25/2013 20:25    2050:  Doubt PE as cause for symptoms with normal CT-A chest.  Doubt ACS as cause for symptoms with normal troponin x2 and unchanged EKG from previous after 2 days of constant symptoms. Pt's lung sounds are louder now after neb, lungs CTA bilat, no wheezing, resps without distress, Sats 97% R/A.  Pt states she feels better after meds and wants to go home. Will tx symptomatically at this time. Dx and testing d/w pt.  Questions answered.  Verb understanding, agreeable to d/c home with outpt f/u.        Laray Anger, DO 01/28/13 1409

## 2013-01-25 NOTE — ED Notes (Signed)
Pt reports sob since yesterday, denies cough. Had flu symptoms one week ago. Went to [facility] today due to mid chest tightness and pain into her left shoulder. ucc told her to come here today to r/o pe. Airway intact, ekg done.

## 2013-01-25 NOTE — Progress Notes (Addendum)
Subjective:  This chart was scribed for Carmen Sorenson, MD by Carl Best, Medical Scribe. This patient was seen in Room 10 and the patient's care was started at 8:18 AM.   Patient ID: Carmen Chaney, female    DOB: 11-04-1970, 42 y.o.   MRN: [id number] Chief Complaint  Patient presents with  . Shortness of Breath  . Cough   HPI HPI Comments: [name] is a 42 y.o. female who presents to the [facility] complaining of ShOB that started two days ago. She has had a protracted illness w/ flu-like sxs beginning over a wk ago.  About 8-9d prev she experienced myalgias, chills, and a fever of 102 degrees. She was not seen for this and did not have a flu test done. Her sxs then turned into severe rhinorrhea and constant sneezing which eventually subsided.  About day 3-4 of her illness, her work started her on once daily prophylactic Tamiflu x 14d as she works at [employer] which has had a ton of employees calling out sick and a lot of residents w/ flu and pneumonia. She states that the Tamiflu makes her feel nauseated.    She states that for the past 2d she has noticed that just walking to the bathroom causes her to become "winded" and she has to sit down to catch her breath.  She states that she had a low-grade temperature of 99 which subsided after she took Advil.  She is also having non-productive cough, palpitations, chest pressure/tightness that is radiating to her back and causing pressure between her shoulder blades, and sneezing as associated symptoms.  She denies chest pain - more just discomfort. She states that she has been using Nyquil to help her sleep with no relief to her symptoms.  She states that she has used an Albuterol Inhaler with no relief to her ShOB.   The patient states that she quit smoking 11 weeks ago.     No past medical history on file. Past Surgical History  Procedure Laterality Date  . Cholecystectomy  2010   Family History  Problem Relation  Age of Onset  . Breast cancer Mother   . Hypertension Mother   . Prostate cancer Father   . Hypertension Brother   . Hypertension Maternal Grandmother   . Hypertension Maternal Grandfather   . Breast cancer Paternal Grandmother   . Diabetes Paternal Grandmother    History   Social History  . Marital Status: Married    Spouse Name: N/A    Number of Children: N/A  . Years of Education: N/A   Occupational History  . Not on file.   Social History Main Topics  . Smoking status: Former Smoker -- 1.00 packs/day for 15 years    Types: Cigarettes  . Smokeless tobacco: Not on file  . Alcohol Use: No  . Drug Use: No  . Sexual Activity: Yes   Other Topics Concern  . Not on file   Social History Narrative  . No narrative on file   No Known Allergies   Review of Systems  Constitutional: Positive for fever, chills, diaphoresis, activity change, appetite change and fatigue. Negative for unexpected weight change.  HENT: Positive for congestion, postnasal drip, rhinorrhea and sneezing. Negative for ear pain, sinus pressure, sore throat and trouble swallowing.   Respiratory: Positive for cough, chest tightness, shortness of breath and wheezing.   Cardiovascular: Positive for palpitations. Negative for chest pain and leg swelling.  Gastrointestinal:  Positive for nausea. Negative for vomiting, abdominal pain and abdominal distention.  Musculoskeletal: Positive for back pain.  Skin: Negative for rash.  Neurological: Positive for dizziness, weakness, light-headedness and headaches.  Hematological: Positive for adenopathy. Does not bruise/bleed easily.  Psychiatric/Behavioral: Positive for sleep disturbance.     BP 102/82  Pulse 70  Temp(Src) 98 F (36.7 C) (Oral)  Resp 18  Wt 172 lb (78.019 kg)  SpO2 99%  Objective:  Physical Exam  Constitutional: She is oriented to person, place, and time. She appears well-developed and well-nourished. No distress.  HENT:  Head: Normocephalic  and atraumatic.  Right Ear: External ear normal. Tympanic membrane is not injected and not retracted. A middle ear effusion is present.  Left Ear: Tympanic membrane is not injected and not retracted. A middle ear effusion is present.  Nose: Nose normal.  Mouth/Throat: Uvula is midline and oropharynx is clear and moist. No oropharyngeal exudate.  Eyes: Conjunctivae and EOM are normal. Pupils are equal, round, and reactive to light.  Neck: Normal range of motion. Neck supple. No thyromegaly present.  Cardiovascular: Normal rate, regular rhythm and normal heart sounds.   No murmur heard. Pulmonary/Chest: Effort normal. She has decreased breath sounds in the right upper field, the right middle field, the right lower field, the left upper field, the left middle field and the left lower field. She has no wheezes. She has no rhonchi. She has no rales.  Abdominal: Soft.  Lymphadenopathy:       Head (right side): No preauricular and no posterior auricular adenopathy present.       Head (left side): No preauricular and no posterior auricular adenopathy present.    She has cervical adenopathy.       Right cervical: Superficial cervical adenopathy present.       Left cervical: Superficial cervical adenopathy present.       Right: No supraclavicular adenopathy present.       Left: No supraclavicular adenopathy present.  Neurological: She is alert and oriented to person, place, and time. She displays normal reflexes. No cranial nerve deficit. She exhibits normal muscle tone. Coordination normal.  Skin: Skin is warm and dry.  Psychiatric: She has a normal mood and affect. Her behavior is normal.    [facility] preliminary x-ray report read by Dr. Clelia Croft -  Chest x-ray: Anterior infiltrate over mediastinum on lateral view.   STAT OVERREAD REQUESTED:   CHEST 2 VIEW  COMPARISON: 03/07/2006  FINDINGS: Normal heart size. Clear lungs. No pneumothorax.  IMPRESSION: No active cardiopulmonary  disease.   Results for orders placed in visit on 01/25/13  POCT CBC      Result Value Range   WBC 7.1  4.6 - 10.2 K/uL   Lymph, poc 3.2  0.6 - 3.4   POC LYMPH PERCENT 44.6  10 - 50 %L   MID (cbc) 0.6  0 - 0.9   POC MID % 8.4  0 - 12 %M   POC Granulocyte 3.3  2 - 6.9   Granulocyte percent 47.0  37 - 80 %G   RBC 4.52  4.04 - 5.48 M/uL   Hemoglobin 13.5  12.2 - 16.2 g/dL   HCT, POC 81.1  91.4 - 47.9 %   MCV 96.8  80 - 97 fL   MCH, POC 29.9  27 - 31.2 pg   MCHC 30.8 (*) 31.8 - 35.4 g/dL   RDW, POC 78.2     Platelet Count, POC 146  142 - 424 K/uL  MPV 12.3  0 - 99.8 fL    EKG reading by Dr. Clelia Croft - Normal sinus rhythm.  J point elevation in medial chest leads V1 V2.  Flip T waves in lead 3 Assessment & Plan:  Discussed with patient that differential includes unstable angina and PE.  Her CBC and chest x-ray are normal.  Her EKG shows some minor signs of strain that may be baseline for her but we do not have any prior EKGs for comparison - EKG changes are very non-specific and may be due to lung disease.  Patient recommended to go to ER for further cardiac evaluation however she does not have insurance at this time and so would like to proceed with conservative management for acute bronchitis. She did not feel any sig improvement after duoneb treatment in office.  Will give Solumedrol 125 mg IM x1 in office and start Z-Pak.  If patient is not feeling better within 12 hours or continues to have any chest tightness, palpitations, or thoracic pain she will go to the ER.  Shortness of breath - Plan: POCT CBC, DG Chest 2 View, albuterol (PROVENTIL) (2.5 MG/3ML) 0.083% nebulizer solution 2.5 mg, ipratropium (ATROVENT) nebulizer solution 0.5 mg, EKG 12-Lead, methylPREDNISolone sodium succinate (SOLU-MEDROL) 125 mg/2 mL injection 125 mg  Cough - Plan: POCT CBC, DG Chest 2 View, albuterol (PROVENTIL) (2.5 MG/3ML) 0.083% nebulizer solution 2.5 mg, ipratropium (ATROVENT) nebulizer solution 0.5 mg, EKG  12-Lead, methylPREDNISolone sodium succinate (SOLU-MEDROL) 125 mg/2 mL injection 125 mg, DISCONTINUED: HYDROcodone-homatropine (HYCODAN) 5-1.5 MG/5ML syrup  Acute bronchitis - Plan: EKG 12-Lead, methylPREDNISolone sodium succinate (SOLU-MEDROL) 125 mg/2 mL injection 125 mg, DISCONTINUED: azithromycin (ZITHROMAX Z-PAK) 250 MG tablet  Chest tightness or pressure - Plan: methylPREDNISolone sodium succinate (SOLU-MEDROL) 125 mg/2 mL injection 125 mg  Meds ordered this encounter  Medications  . oseltamivir (TAMIFLU) 75 MG capsule    Sig: Take 75 mg by mouth See admin instructions. Take for 14 days started medication on 01-21-13  . albuterol (PROVENTIL) (2.5 MG/3ML) 0.083% nebulizer solution 2.5 mg    Sig:   . ipratropium (ATROVENT) nebulizer solution 0.5 mg    Sig:   . DISCONTD: HYDROcodone-homatropine (HYCODAN) 5-1.5 MG/5ML syrup    Sig: Take 5 mLs by mouth every 8 (eight) hours as needed for cough.    Dispense:  90 mL    Refill:  0  . DISCONTD: azithromycin (ZITHROMAX Z-PAK) 250 MG tablet    Sig: Use as a zpack    Dispense:  6 each    Refill:  0  . DISCONTD: predniSONE (DELTASONE) 10 MG tablet    Sig: Take 6 tabs po d1, 5 tabs po d2, 4 tabs po d3, 3 tabs po d4, 2 tabs po d5, 1 tab po d6.    Dispense:  21 tablet    Refill:  0  . methylPREDNISolone sodium succinate (SOLU-MEDROL) 125 mg/2 mL injection 125 mg    Sig:     I personally performed the services described in this documentation, which was scribed in my presence. The recorded information has been reviewed and considered, and addended by me as needed.  Carmen Sorenson, MD MPH

## 2013-01-25 NOTE — ED Notes (Signed)
Urine preg changed to POCT urine to decrease delay in CT

## 2013-01-25 NOTE — Patient Instructions (Addendum)
If you do not feel better within 12 hours or if your chest pain, pressure, tightness, palpitations, lightheadedness, back/throacic pain is getting worse at all, call 911 immediately and go to the ER as we have not ruled out the possibility of pulmonary embolism or unstable angina/acute coronary syndrome as a cause of your symptoms.   Start the steroid dose pack tomorrow morning. Start the zpack today.  Acute Bronchitis Bronchitis is inflammation of the airways that extend from the windpipe into the lungs (bronchi). The inflammation often causes mucus to develop. This leads to a cough, which is the most common symptom of bronchitis.  In acute bronchitis, the condition usually develops suddenly and goes away over time, usually in a couple weeks. Smoking, allergies, and asthma can make bronchitis worse. Repeated episodes of bronchitis may cause further lung problems.  CAUSES Acute bronchitis is most often caused by the same virus that causes a cold. The virus can spread from person to person (contagious).  SIGNS AND SYMPTOMS   Cough.   Fever.   Coughing up mucus.   Body aches.   Chest congestion.   Chills.   Shortness of breath.   Sore throat.  DIAGNOSIS  Acute bronchitis is usually diagnosed through a physical exam. Tests, such as chest X-rays, are sometimes done to rule out other conditions.  TREATMENT  Acute bronchitis usually goes away in a couple weeks. Often times, no medical treatment is necessary. Medicines are sometimes given for relief of fever or cough. Antibiotics are usually not needed but may be prescribed in certain situations. In some cases, an inhaler may be recommended to help reduce shortness of breath and control the cough. A cool mist vaporizer may also be used to help thin bronchial secretions and make it easier to clear the chest.  HOME CARE INSTRUCTIONS  Get plenty of rest.   Drink enough fluids to keep your urine clear or pale yellow (unless you have a  medical condition that requires fluid restriction). Increasing fluids may help thin your secretions and will prevent dehydration.   Only take over-the-counter or prescription medicines as directed by your health care provider.   Avoid smoking and secondhand smoke. Exposure to cigarette smoke or irritating chemicals will make bronchitis worse. If you are a smoker, consider using nicotine gum or skin patches to help control withdrawal symptoms. Quitting smoking will help your lungs heal faster.   Reduce the chances of another bout of acute bronchitis by washing your hands frequently, avoiding people with cold symptoms, and trying not to touch your hands to your mouth, nose, or eyes.   Follow up with your health care provider as directed.  SEEK MEDICAL CARE IF: Your symptoms do not improve after 1 week of treatment.  SEEK IMMEDIATE MEDICAL CARE IF:  You develop an increased fever or chills.   You have chest pain.   You have severe shortness of breath.  You have bloody sputum.   You develop dehydration.  You develop fainting.  You develop repeated vomiting.  You develop a severe headache. MAKE SURE YOU:   Understand these instructions.  Will watch your condition.  Will get help right away if you are not doing well or get worse. Document Released: 02/23/2004 Document Revised: 09/17/2012 Document Reviewed: 07/08/2012 [facility] Patient Information 2014 [place], [place].

## 2013-09-29 ENCOUNTER — Ambulatory Visit (INDEPENDENT_AMBULATORY_CARE_PROVIDER_SITE_OTHER): Payer: Self-pay | Admitting: Internal Medicine

## 2013-09-29 VITALS — BP 114/76 | HR 100 | Temp 97.6°F | Resp 18 | Ht 63.0 in | Wt 174.0 lb

## 2013-09-29 DIAGNOSIS — J0100 Acute maxillary sinusitis, unspecified: Secondary | ICD-10-CM

## 2013-09-29 DIAGNOSIS — J0101 Acute recurrent maxillary sinusitis: Secondary | ICD-10-CM

## 2013-09-29 MED ORDER — AMOXICILLIN 500 MG PO CAPS: 1000.0000 mg | ORAL_CAPSULE | Freq: Two times a day (BID) | ORAL | Status: DC

## 2013-09-29 MED ORDER — FLUTICASONE PROPIONATE 50 MCG/ACT NA SUSP
2.0000 | Freq: Every day | NASAL | Status: AC
Start: 2013-09-29 — End: ?

## 2013-09-29 NOTE — Patient Instructions (Signed)

## 2013-09-29 NOTE — Progress Notes (Signed)
   Subjective:    Patient ID: Carmen Chaney, female    DOB: 22-Nov-1970, 43 y.o.   MRN: [id number]  HPI Allergys and sinus infection.   Review of Systems     Objective:   Physical Exam  Constitutional: She is oriented to person, place, and time. She appears well-developed and well-nourished.  HENT:  Head: Normocephalic.  Right Ear: External ear normal.  Left Ear: External ear normal.  Nose: Right sinus exhibits maxillary sinus tenderness. Right sinus exhibits no frontal sinus tenderness. Left sinus exhibits no maxillary sinus tenderness and no frontal sinus tenderness.  Eyes: EOM are normal. Pupils are equal, round, and reactive to light.  Neck: Normal range of motion.  Pulmonary/Chest: Effort normal.  Neurological: She is alert and oriented to person, place, and time.  Psychiatric: She has a normal mood and affect.          Assessment & Plan:  Right maxillary sinusitis Amoxil/Fluticasone

## 2015-10-06 ENCOUNTER — Encounter (INDEPENDENT_AMBULATORY_CARE_PROVIDER_SITE_OTHER): Payer: Self-pay

## 2015-10-06 ENCOUNTER — Encounter: Payer: Self-pay | Admitting: Family Medicine

## 2015-10-06 ENCOUNTER — Ambulatory Visit (INDEPENDENT_AMBULATORY_CARE_PROVIDER_SITE_OTHER): Payer: 59 | Admitting: Family Medicine

## 2015-10-06 VITALS — BP 105/70 | HR 68 | Ht 63.0 in | Wt 161.0 lb

## 2015-10-06 DIAGNOSIS — J309 Allergic rhinitis, unspecified: Secondary | ICD-10-CM

## 2015-10-06 DIAGNOSIS — J302 Other seasonal allergic rhinitis: Secondary | ICD-10-CM

## 2015-10-06 DIAGNOSIS — Z6828 Body mass index (BMI) 28.0-28.9, adult: Secondary | ICD-10-CM

## 2015-10-06 DIAGNOSIS — F17200 Nicotine dependence, unspecified, uncomplicated: Secondary | ICD-10-CM

## 2015-10-06 DIAGNOSIS — J3089 Other allergic rhinitis: Secondary | ICD-10-CM

## 2015-10-06 DIAGNOSIS — H548 Legal blindness, as defined in USA: Secondary | ICD-10-CM

## 2015-10-06 DIAGNOSIS — Z720 Tobacco use: Secondary | ICD-10-CM

## 2015-10-06 DIAGNOSIS — Z3002 Counseling and instruction in natural family planning to avoid pregnancy: Secondary | ICD-10-CM

## 2015-10-06 DIAGNOSIS — R030 Elevated blood-pressure reading, without diagnosis of hypertension: Secondary | ICD-10-CM | POA: Diagnosis not present

## 2015-10-06 NOTE — Assessment & Plan Note (Addendum)
Rechecked by me today shows 114/72 in the left arm and 122/76 in the right arm after patient sitting quietly for 15-20 minutes.  - Extensive counseling done.   -DASH diet - Routine cardiovascular exercise to help lower blood pressure and weight. -Advised to check blood pressure after sitting for 15-20 minutes and not having any caffeine etc.

## 2015-10-06 NOTE — Patient Instructions (Signed)
Hypertension  Hypertension, commonly called high blood pressure, is when the force of blood pumping through your arteries is too strong. Your arteries are the blood vessels that carry blood from your heart throughout your body. A blood pressure reading consists of a higher number over a lower number, such as 110/72. The higher number (systolic) is the pressure inside your arteries when your heart pumps. The lower number (diastolic) is the pressure inside your arteries when your heart relaxes. Ideally you want your blood pressure below 120/80.  Hypertension forces your heart to work harder to pump blood. Your arteries may become narrow or stiff. Having untreated or uncontrolled hypertension can cause heart attack, stroke, kidney disease, and other problems.  RISK FACTORS  Some risk factors for high blood pressure are controllable. Others are not.   Risk factors you cannot control include:   · Race. You may be at higher risk if you are African American.  · Age. Risk increases with age.  · Gender. Men are at higher risk than women before age 45 years. After age 65, women are at higher risk than men.  Risk factors you can control include:  · Not getting enough exercise or physical activity.  · Being overweight.  · Getting too much fat, sugar, calories, or salt in your diet.  · Drinking too much alcohol.  SIGNS AND SYMPTOMS  Hypertension does not usually cause signs or symptoms. Extremely high blood pressure (hypertensive crisis) may cause headache, anxiety, shortness of breath, and nosebleed.  DIAGNOSIS  To check if you have hypertension, your health care provider will measure your blood pressure while you are seated, with your arm held at the level of your heart. It should be measured at least twice using the same arm. Certain conditions can cause a difference in blood pressure between your right and left arms. A blood pressure reading that is higher than normal on one occasion does not mean that you need treatment. If  it is not clear whether you have high blood pressure, you may be asked to return on a different day to have your blood pressure checked again. Or, you may be asked to monitor your blood pressure at home for 1 or more weeks.  TREATMENT  Treating high blood pressure includes making lifestyle changes and possibly taking medicine. Living a healthy lifestyle can help lower high blood pressure. You may need to change some of your habits.  Lifestyle changes may include:  · Following the DASH diet. This diet is high in fruits, vegetables, and whole grains. It is low in salt, red meat, and added sugars.  · Keep your sodium intake below 2,300 mg per day.  · Getting at least 30-45 minutes of aerobic exercise at least 4 times per week.  · Losing weight if necessary.  · Not smoking.  · Limiting alcoholic beverages.  · Learning ways to reduce stress.  Your health care provider may prescribe medicine if lifestyle changes are not enough to get your blood pressure under control, and if one of the following is true:  · You are 18-59 years of age and your systolic blood pressure is above 140.  · You are 60 years of age or older, and your systolic blood pressure is above 150.  · Your diastolic blood pressure is above 90.  · You have diabetes, and your systolic blood pressure is over 140 or your diastolic blood pressure is over 90.  · You have kidney disease and your blood pressure is above   140/90.  · You have heart disease and your blood pressure is above 140/90.  Your personal target blood pressure may vary depending on your medical conditions, your age, and other factors.  HOME CARE INSTRUCTIONS  · Have your blood pressure rechecked as directed by your health care provider.    · Take medicines only as directed by your health care provider. Follow the directions carefully. Blood pressure medicines must be taken as prescribed. The medicine does not work as well when you skip doses. Skipping doses also puts you at risk for  problems.  · Do not smoke.    · Monitor your blood pressure at home as directed by your health care provider.   SEEK MEDICAL CARE IF:   · You think you are having a reaction to medicines taken.  · You have recurrent headaches or feel dizzy.  · You have swelling in your ankles.  · You have trouble with your vision.  SEEK IMMEDIATE MEDICAL CARE IF:  · You develop a severe headache or confusion.  · You have unusual weakness, numbness, or feel faint.  · You have severe chest or abdominal pain.  · You vomit repeatedly.  · You have trouble breathing.  MAKE SURE YOU:   · Understand these instructions.  · Will watch your condition.  · Will get help right away if you are not doing well or get worse.     This information is not intended to replace advice given to you by your health care provider. Make sure you discuss any questions you have with your health care provider.     Document Released: 01/15/2005 Document Revised: 06/01/2014 Document Reviewed: 11/07/2012  Elsevier Interactive Patient Education ©2016 Elsevier Inc.  DASH Eating Plan  DASH stands for "Dietary Approaches to Stop Hypertension." The DASH eating plan is a healthy eating plan that has been shown to reduce high blood pressure (hypertension). Additional health benefits may include reducing the risk of type 2 diabetes mellitus, heart disease, and stroke. The DASH eating plan may also help with weight loss.  WHAT DO I NEED TO KNOW ABOUT THE DASH EATING PLAN?  For the DASH eating plan, you will follow these general guidelines:  · Choose foods with a percent daily value for sodium of less than 5% (as listed on the food label).  · Use salt-free seasonings or herbs instead of table salt or sea salt.  · Check with your health care provider or pharmacist before using salt substitutes.  · Eat lower-sodium products, often labeled as "lower sodium" or "no salt added."  · Eat fresh foods.  · Eat more vegetables, fruits, and low-fat dairy products.  · Choose whole grains.  Look for the word "whole" as the first word in the ingredient list.  · Choose fish and skinless chicken or turkey more often than red meat. Limit fish, poultry, and meat to 6 oz (170 g) each day.  · Limit sweets, desserts, sugars, and sugary drinks.  · Choose heart-healthy fats.  · Limit cheese to 1 oz (28 g) per day.  · Eat more home-cooked food and less restaurant, buffet, and fast food.  · Limit fried foods.  · Cook foods using methods other than frying.  · Limit canned vegetables. If you do use them, rinse them well to decrease the sodium.  · When eating at a restaurant, ask that your food be prepared with less salt, or no salt if possible.  WHAT FOODS CAN I EAT?  Seek help from a dietitian for   individual calorie needs.  Grains  Whole grain or whole wheat bread. Brown rice. Whole grain or whole wheat pasta. Quinoa, bulgur, and whole grain cereals. Low-sodium cereals. Corn or whole wheat flour tortillas. Whole grain cornbread. Whole grain crackers. Low-sodium crackers.  Vegetables  Fresh or frozen vegetables (raw, steamed, roasted, or grilled). Low-sodium or reduced-sodium tomato and vegetable juices. Low-sodium or reduced-sodium tomato sauce and paste. Low-sodium or reduced-sodium canned vegetables.   Fruits  All fresh, canned (in natural juice), or frozen fruits.  Meat and Other Protein Products  Ground beef (85% or leaner), grass-fed beef, or beef trimmed of fat. Skinless chicken or turkey. Ground chicken or turkey. Pork trimmed of fat. All fish and seafood. Eggs. Dried beans, peas, or lentils. Unsalted nuts and seeds. Unsalted canned beans.  Dairy  Low-fat dairy products, such as skim or 1% milk, 2% or reduced-fat cheeses, low-fat ricotta or cottage cheese, or plain low-fat yogurt. Low-sodium or reduced-sodium cheeses.  Fats and Oils  Tub margarines without trans fats. Light or reduced-fat mayonnaise and salad dressings (reduced sodium). Avocado. Safflower, olive, or canola oils. Natural peanut or almond  butter.  Other  Unsalted popcorn and pretzels.  The items listed above may not be a complete list of recommended foods or beverages. Contact your dietitian for more options.  WHAT FOODS ARE NOT RECOMMENDED?  Grains  White bread. White pasta. White rice. Refined cornbread. Bagels and croissants. Crackers that contain trans fat.  Vegetables  Creamed or fried vegetables. Vegetables in a cheese sauce. Regular canned vegetables. Regular canned tomato sauce and paste. Regular tomato and vegetable juices.  Fruits  Dried fruits. Canned fruit in light or heavy syrup. Fruit juice.  Meat and Other Protein Products  Fatty cuts of meat. Ribs, chicken wings, bacon, sausage, bologna, salami, chitterlings, fatback, hot dogs, bratwurst, and packaged luncheon meats. Salted nuts and seeds. Canned beans with salt.  Dairy  Whole or 2% milk, cream, half-and-half, and cream cheese. Whole-fat or sweetened yogurt. Full-fat cheeses or blue cheese. Nondairy creamers and whipped toppings. Processed cheese, cheese spreads, or cheese curds.  Condiments  Onion and garlic salt, seasoned salt, table salt, and sea salt. Canned and packaged gravies. Worcestershire sauce. Tartar sauce. Barbecue sauce. Teriyaki sauce. Soy sauce, including reduced sodium. Steak sauce. Fish sauce. Oyster sauce. Cocktail sauce. Horseradish. Ketchup and mustard. Meat flavorings and tenderizers. Bouillon cubes. Hot sauce. Tabasco sauce. Marinades. Taco seasonings. Relishes.  Fats and Oils  Butter, stick margarine, lard, shortening, ghee, and bacon fat. Coconut, palm kernel, or palm oils. Regular salad dressings.  Other  Pickles and olives. Salted popcorn and pretzels.  The items listed above may not be a complete list of foods and beverages to avoid. Contact your dietitian for more information.  WHERE CAN I FIND MORE INFORMATION?  National Heart, Lung, and Blood Institute: www.nhlbi.nih.gov/health/health-topics/topics/dash/     This information is not intended to replace  advice given to you by your health care provider. Make sure you discuss any questions you have with your health care provider.     Document Released: 01/04/2011 Document Revised: 02/05/2014 Document Reviewed: 11/19/2012  Elsevier Interactive Patient Education ©2016 Elsevier Inc.    How to Take Your Blood Pressure  HOW DO I GET A BLOOD PRESSURE MACHINE?  · You can buy an electronic home blood pressure machine at your local pharmacy. Insurance will sometimes cover the cost if you have a prescription.  · Ask your doctor what type of machine is best for you. There are different machines   for your arm and your wrist.  · If you decide to buy a machine to check your blood pressure on your arm, first check the size of your arm so you can buy the right size cuff. To check the size of your arm:      Use a measuring tape that shows both inches and centimeters.      Wrap the measuring tape around the upper-middle part of your arm. You may need someone to help you measure.      Write down your arm measurement in both inches and centimeters.    · To measure your blood pressure correctly, it is important to have the right size cuff.      If your arm is up to 13 inches (up to 34 centimeters), get an adult cuff size.    If your arm is 13 to 17 inches (35 to 44 centimeters), get a large adult cuff size.       If your arm is 17 to 20 inches (45 to 52 centimeters), get an adult thigh cuff.    WHAT DO THE NUMBERS MEAN?   · There are two numbers that make up your blood pressure. For example: 120/80.    The first number (120 in our example) is called the "systolic pressure." It is a measure of the pressure in your blood vessels when your heart is pumping blood.    The second number (80 in our example) is called the "diastolic pressure." It is a measure of the pressure in your blood vessels when your heart is resting between beats.  · Your doctor will tell you what your blood pressure should be.  WHAT SHOULD I DO BEFORE I CHECK MY BLOOD  PRESSURE?   · Try to rest or relax for at least 30 minutes before you check your blood pressure.  · Do not smoke.  · Do not have any drinks with caffeine, such as:    Soda.    Coffee.    Tea.  · Check your blood pressure in a quiet room.  · Sit down and stretch out your arm on a table. Keep your arm at about the level of your heart. Let your arm relax.  · Make sure that your legs are not crossed.  HOW DO I CHECK MY BLOOD PRESSURE?  · Follow the directions that came with your machine.  · Make sure you remove any tight-fitting clothing from your arm or wrist. Wrap the cuff around your upper arm or wrist. You should be able to fit a finger between the cuff and your arm. If you cannot fit a finger between the cuff and your arm, it is too tight and should be removed and rewrapped.  · Some units require you to manually pump up the arm cuff.  · Automatic units inflate the cuff when you press a button.  · Cuff deflation is automatic in both models.  · After the cuff is inflated, the unit measures your blood pressure and pulse. The readings are shown on a monitor. Hold still and breathe normally while the cuff is inflated.  · Getting a reading takes less than a minute.  · Some models store readings in a memory. Some provide a printout of readings. If your machine does not store your readings, keep a written record.  · Take readings with you to your next visit with your doctor.     This information is not intended to replace advice given to you by your health care provider. Make sure you   discuss any questions you have with your health care provider.     Document Released: 12/29/2007 Document Revised: 02/05/2014 Document Reviewed: 03/12/2013  Elsevier Interactive Patient Education ©2016 Elsevier Inc.

## 2015-10-06 NOTE — Progress Notes (Signed)
New patient office visit note:  Impression and Recommendations:    1. Elevated blood pressure (not hypertension)   2. Seasonal and perennial allergic rhinitis   3. Legally blind in left eye, as defined in [place]   4. BMI 28.0-28.9,adult   5. Former Smoker- 15 pack yr   6. Counseling and instruction in natural family planning to avoid pregnancy     Elevated blood pressure (not hypertension) Rechecked by me today shows 114/72 in the left arm and 122/76 in the right arm after patient sitting quietly for 15-20 minutes.  - Extensive counseling done.   -DASH diet - Routine cardiovascular exercise to help lower blood pressure and weight. -Advised to check blood pressure after sitting for 15-20 minutes and not having any caffeine etc.  Seasonal and perennial allergic rhinitis - Seasonal and environmental allergies discussed with patient.  Preventative strategies as first line for management discussed and I encouraged use of sterile saline rinses such as Lloyd Huger med sinus rinses to be done twice daily and after any prolonged exposure to the environment or allergen.      - Discussed the use of over-the-counter medications FLONASE DAILY for symptom control as well   - Encouraged to return to clinic or call the office today discussed further questions or concerns they may have.  Former Smoker- 15 pack yr Patient understands this puts her at increased risk for certain medical conditions in the future.  - She does not exercise at all and I encouraged her to do so.  Contraceptive management Patient has a GYN. She has an IUD in place.    Orders Placed This Encounter  Procedures  . CBC with Differential/Platelet  . COMPLETE METABOLIC PANEL WITH GFR  . Hemoglobin A1c  . Lipid panel  . T4, free  . TSH  . VITAMIN D 25 Hydroxy (Vit-D Deficiency, Fractures)    Patient's Medications  New Prescriptions   No medications on file  Previous Medications   FLUTICASONE (FLONASE) 50  MCG/ACT NASAL SPRAY    Place 2 sprays into both nostrils daily.   MULTIPLE VITAMIN (MULTIVITAMIN) TABLET    Take 1 tablet by mouth daily.  Modified Medications   No medications on file  Discontinued Medications   AMOXICILLIN (AMOXIL) 500 MG CAPSULE    Take 2 capsules (1,000 mg total) by mouth 2 (two) times daily.   AZITHROMYCIN (ZITHROMAX) 250 MG TABLET    Take 250 mg by mouth See admin instructions. Use as a zpack. Started medication on 01-25-13   CETIRIZINE-PSEUDOEPHEDRINE (ZYRTEC-D PO)    Take 1 tablet by mouth daily as needed. For allergy   HYDROCODONE-ACETAMINOPHEN (NORCO/VICODIN) 5-325 MG PER TABLET    1 or 2 tabs PO q6 hours prn pain   OSELTAMIVIR (TAMIFLU) 75 MG CAPSULE    Take 75 mg by mouth See admin instructions. Take for 14 days started medication on 01-21-13    Return for Fasting blood work- near future; then OV with me  4 wks. .  The patient was counseled, risk factors were discussed, anticipatory guidance given.  Gross side effects, risk and benefits, and alternatives of medications discussed with patient.  Patient is aware that all medications have potential side effects and we are unable to predict every side effect or drug-drug interaction that may occur.  Expresses verbal understanding and consents to current therapy plan and treatment regimen.  Please see AVS handed out to patient at the end of our visit for further patient instructions/  counseling done pertaining to today's office visit.    Note: This document was prepared using Dragon voice recognition software and may include unintentional dictation errors.  ----------------------------------------------------------------------------------------------------------------------    Subjective:    Chief Complaint  Patient presents with  . Establish Care    HPI: Carmen Chaney is a pleasant 45 y.o. female who presents to [facility] today to review their medical history with me and establish  care.    [place]oved [place].  No PCP here.   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  Married- Kathlene November: works Dance movement psychotherapist yrs. 3 stepkids, no kids of own. 2 dogs.   Smoker- former- quit 10/16--> 15 pk yr hx.   She has a GYN--> [facility] [employer]- can't recall name.    Started feeling bad around mid- Aug. Then started checking her BP at work due to HA- at back of head, and some dizziness- only with driving; no longer with symptoms.   Home monitoring- 177-114/ 104-76.  Last couple weeks only.   She is here because she is worried she has hypertension, and wants to get lab work etc. She denies any symptoms today besides some mild seasonal allergies.     Wt Readings from Last 3 Encounters:  10/06/15 161 lb (73 kg)  09/29/13 174 lb (78.9 kg)  01/25/13 165 lb (74.8 kg)   BP Readings from Last 3 Encounters:  10/06/15 105/70  09/29/13 114/76  01/25/13 (!) 112/53   Pulse Readings from Last 3 Encounters:  10/06/15 68  09/29/13 100  01/25/13 107     Patient Active Problem List   Diagnosis Date Noted  . Contraceptive management 10/09/2015  . Seasonal and perennial allergic rhinitis 10/06/2015  . Legally blind in left eye, as defined in [place] 10/06/2015  . BMI 28.0-28.9,adult 10/06/2015  . Elevated blood pressure (not hypertension) 10/06/2015  . Former Smoker- 15 pack yr 06/18/2012     No past medical history on file.   Past Surgical History:  Procedure Laterality Date  . CHOLECYSTECTOMY  2010     Family History  Problem Relation Age of Onset  . Breast cancer Mother   . Hypertension Mother   . Heart attack Mother   . Hyperlipidemia Mother   . Prostate cancer Father   . Depression Father   . Hypertension Brother   . Hypertension Maternal Grandmother   . Stroke Maternal Grandmother   . Hypertension Maternal Grandfather   . Breast cancer Paternal Grandmother   . Diabetes Paternal Grandmother      History  Drug Use No     History  Alcohol Use No    History  Smoking Status  . Former Smoker  . Packs/day: 1.00  . Years: 15.00  . Types: Cigarettes  . Quit date: 01/29/2014  Smokeless Tobacco  . Never Used    Patient's Medications  New Prescriptions   No medications on file  Previous Medications   FLUTICASONE (FLONASE) 50 MCG/ACT NASAL SPRAY    Place 2 sprays into both nostrils daily.   MULTIPLE VITAMIN (MULTIVITAMIN) TABLET    Take 1 tablet by mouth daily.  Modified Medications   No medications on file  Discontinued Medications   AMOXICILLIN (AMOXIL) 500 MG CAPSULE    Take 2 capsules (1,000 mg total) by mouth 2 (two) times daily.   AZITHROMYCIN (ZITHROMAX) 250 MG TABLET    Take 250 mg by mouth See admin instructions. Use  as a zpack. Started medication on 01-25-13   CETIRIZINE-PSEUDOEPHEDRINE (ZYRTEC-D PO)    Take 1 tablet by mouth daily as needed. For allergy   HYDROCODONE-ACETAMINOPHEN (NORCO/VICODIN) 5-325 MG PER TABLET    1 or 2 tabs PO q6 hours prn pain   OSELTAMIVIR (TAMIFLU) 75 MG CAPSULE    Take 75 mg by mouth See admin instructions. Take for 14 days started medication on 01-21-13    Allergies: Dilaudid [hydromorphone hcl]  Review of Systems:   ( Completed via Adult Medical History Intake form today ) General:  Denies fever, chills, appetite changes, unexplained weight loss.  Optho/Auditory:   Denies visual changes, blurred vision/LOV, ringing in ears/ diff hearing Respiratory:   Denies SOB, DOE, cough, wheezing.  Cardiovascular:   Denies chest pain, palpitations, new onset peripheral edema  Gastrointestinal:   Denies nausea, vomiting, diarrhea.  Genitourinary:    Denies dysuria, increased frequency, flank pain.  Endocrine:     Denies hot or cold intolerance, polyuria, polydipsia. Musculoskeletal:  Denies unexplained myalgias, joint swelling, arthralgias, gait problems.  Skin:  Denies rash, suspicious lesions or new/ changes in moles Neurological:    Denies dizziness, syncope,  unexplained weakness, lightheadedness, numbness  Psychiatric/Behavioral:   Denies mood changes, suicidal or homicidal ideations, hallucinations    Objective:   Blood pressure 105/70, pulse 68, height 5\' 3"  (1.6 m), weight 161 lb (73 kg). Body mass index is 28.52 kg/m.   General: Well Developed, well nourished, and in no acute distress.  Neuro: Alert and oriented x3, extra-ocular muscles intact, sensation grossly intact.  HEENT: Normocephalic, atraumatic, pupils equal round reactive to light, Conjunctiva clear, TMs within normal limits, external auditory canals clear. Oropharynx-clear. Nares mildly edematous. neck supple, no gross masses, no carotid bruits, no JVD apprec Skin: no gross suspicious lesions or rashes  Cardiac: Regular rate and rhythm, no murmurs rubs or gallops.  Respiratory: Essentially clear to auscultation bilaterally. Not using accessory muscles, speaking in full sentences.  Abdominal: Soft, not grossly distended Musculoskeletal: Ambulates w/o diff, FROM * 4 ext.  Vasc: less 2 sec cap RF, warm and pink  Psych:  No HI/SI, judgement and insight good, Euthymic mood. Full Affect.

## 2015-10-07 ENCOUNTER — Other Ambulatory Visit (INDEPENDENT_AMBULATORY_CARE_PROVIDER_SITE_OTHER): Payer: 59

## 2015-10-07 DIAGNOSIS — Z6828 Body mass index (BMI) 28.0-28.9, adult: Secondary | ICD-10-CM

## 2015-10-07 DIAGNOSIS — R030 Elevated blood-pressure reading, without diagnosis of hypertension: Secondary | ICD-10-CM

## 2015-10-07 LAB — CBC WITH DIFFERENTIAL/PLATELET
BASOS ABS: 77 {cells}/uL (ref 0–200)
Basophils Relative: 1 %
Eosinophils Absolute: 847 cells/uL — ABNORMAL HIGH (ref 15–500)
Eosinophils Relative: 11 %
HEMATOCRIT: 46.2 % — AB (ref 35.0–45.0)
HEMOGLOBIN: 15.1 g/dL (ref 11.7–15.5)
LYMPHS ABS: 2849 {cells}/uL (ref 850–3900)
Lymphocytes Relative: 37 %
MCH: 30 pg (ref 27.0–33.0)
MCHC: 32.7 g/dL (ref 32.0–36.0)
MCV: 91.8 fL (ref 80.0–100.0)
MONO ABS: 462 {cells}/uL (ref 200–950)
MPV: 13.7 fL — ABNORMAL HIGH (ref 7.5–12.5)
Monocytes Relative: 6 %
NEUTROS ABS: 3465 {cells}/uL (ref 1500–7800)
NEUTROS PCT: 45 %
Platelets: 174 10*3/uL (ref 140–400)
RBC: 5.03 MIL/uL (ref 3.80–5.10)
RDW: 13.2 % (ref 11.0–15.0)
WBC: 7.7 10*3/uL (ref 3.8–10.8)

## 2015-10-08 LAB — COMPLETE METABOLIC PANEL WITH GFR
ALBUMIN: 4.3 g/dL (ref 3.6–5.1)
ALT: 30 U/L — ABNORMAL HIGH (ref 6–29)
AST: 23 U/L (ref 10–35)
Alkaline Phosphatase: 72 U/L (ref 33–115)
BILIRUBIN TOTAL: 0.5 mg/dL (ref 0.2–1.2)
BUN: 10 mg/dL (ref 7–25)
CALCIUM: 9.9 mg/dL (ref 8.6–10.2)
CO2: 28 mmol/L (ref 20–31)
Chloride: 106 mmol/L (ref 98–110)
Creat: 1.01 mg/dL (ref 0.50–1.10)
GFR, Est African American: 78 mL/min (ref >=60)
GFR, Est Non African American: 67 mL/min (ref >=60)
GLUCOSE: 97 mg/dL (ref 65–99)
POTASSIUM: 5.1 mmol/L (ref 3.5–5.3)
Sodium: 141 mmol/L (ref 135–146)
TOTAL PROTEIN: 6.8 g/dL (ref 6.1–8.1)

## 2015-10-08 LAB — TSH: TSH: 1.35 mIU/L

## 2015-10-08 LAB — LIPID PANEL
CHOL/HDL RATIO: 2.4 ratio (ref <=5.0)
Cholesterol: 156 mg/dL (ref 125–200)
HDL: 65 mg/dL (ref >=46)
LDL CALC: 73 mg/dL (ref <130)
TRIGLYCERIDES: 89 mg/dL (ref <150)
VLDL: 18 mg/dL (ref <30)

## 2015-10-08 LAB — HEMOGLOBIN A1C
Hgb A1c MFr Bld: 5.1 % (ref <5.7)
Mean Plasma Glucose: 100 mg/dL

## 2015-10-08 LAB — VITAMIN D 25 HYDROXY (VIT D DEFICIENCY, FRACTURES): Vit D, 25-Hydroxy: 27 ng/mL — ABNORMAL LOW (ref 30–100)

## 2015-10-08 LAB — T4, FREE: Free T4: 1 ng/dL (ref 0.8–1.8)

## 2015-10-09 DIAGNOSIS — Z309 Encounter for contraceptive management, unspecified: Secondary | ICD-10-CM | POA: Insufficient documentation

## 2015-10-09 NOTE — Assessment & Plan Note (Signed)
Patient understands this puts her at increased risk for certain medical conditions in the future.  - She does not exercise at all and I encouraged her to do so.

## 2015-10-09 NOTE — Assessment & Plan Note (Signed)
Patient has a GYN. She has an IUD in place.

## 2015-10-09 NOTE — Assessment & Plan Note (Signed)
-   Seasonal and environmental allergies discussed with patient.  Preventative strategies as first line for management discussed and I encouraged use of sterile saline rinses such as Lloyd HugerNeil med sinus rinses to be done twice daily and after any prolonged exposure to the environment or allergen.      - Discussed the use of over-the-counter medications FLONASE DAILY for symptom control as well   - Encouraged to return to clinic or call the office today discussed further questions or concerns they may have.

## 2017-01-28 ENCOUNTER — Emergency Department (HOSPITAL_COMMUNITY): Payer: 59

## 2017-01-28 ENCOUNTER — Encounter (HOSPITAL_COMMUNITY): Payer: Self-pay | Admitting: Emergency Medicine

## 2017-01-28 ENCOUNTER — Emergency Department (HOSPITAL_COMMUNITY)
Admission: EM | Admit: 2017-01-28 | Discharge: 2017-01-28 | Disposition: A | Discharge status: Home / Self Care | Payer: 59 | Attending: Emergency Medicine | Admitting: Emergency Medicine

## 2017-01-28 DIAGNOSIS — M6281 Muscle weakness (generalized): Secondary | ICD-10-CM | POA: Insufficient documentation

## 2017-01-28 DIAGNOSIS — Z885 Allergy status to narcotic agent status: Secondary | ICD-10-CM | POA: Insufficient documentation

## 2017-01-28 DIAGNOSIS — R200 Anesthesia of skin: Secondary | ICD-10-CM | POA: Diagnosis not present

## 2017-01-28 DIAGNOSIS — R0789 Other chest pain: Secondary | ICD-10-CM | POA: Insufficient documentation

## 2017-01-28 DIAGNOSIS — Z79899 Other long term (current) drug therapy: Secondary | ICD-10-CM | POA: Insufficient documentation

## 2017-01-28 DIAGNOSIS — Z87891 Personal history of nicotine dependence: Secondary | ICD-10-CM | POA: Diagnosis not present

## 2017-01-28 DIAGNOSIS — R29818 Other symptoms and signs involving the nervous system: Secondary | ICD-10-CM | POA: Insufficient documentation

## 2017-01-28 DIAGNOSIS — R51 Headache: Secondary | ICD-10-CM | POA: Insufficient documentation

## 2017-01-28 LAB — CBC
HEMATOCRIT: 41.2 % (ref 36.0–46.0)
HEMOGLOBIN: 13.6 g/dL (ref 12.0–15.0)
MCH: 30.8 pg (ref 26.0–34.0)
MCHC: 33 g/dL (ref 30.0–36.0)
MCV: 93.4 fL (ref 78.0–100.0)
Platelets: 155 10*3/uL (ref 150–400)
RBC: 4.41 MIL/uL (ref 3.87–5.11)
RDW: 13.7 % (ref 11.5–15.5)
WBC: 8.6 10*3/uL (ref 4.0–10.5)

## 2017-01-28 LAB — I-STAT TROPONIN, ED
TROPONIN I, POC: 0.01 ng/mL (ref 0.00–0.08)
Troponin i, poc: 0 ng/mL (ref 0.00–0.08)

## 2017-01-28 LAB — BASIC METABOLIC PANEL
ANION GAP: 6 (ref 5–15)
BUN: 8 mg/dL (ref 6–20)
CO2: 28 mmol/L (ref 22–32)
Calcium: 8.9 mg/dL (ref 8.9–10.3)
Chloride: 107 mmol/L (ref 101–111)
Creatinine, Ser: 0.94 mg/dL (ref 0.44–1.00)
GFR calc Af Amer: >60 mL/min (ref >60)
Glucose, Bld: 126 mg/dL — ABNORMAL HIGH (ref 65–99)
POTASSIUM: 3 mmol/L — AB (ref 3.5–5.1)
SODIUM: 141 mmol/L (ref 135–145)

## 2017-01-28 MED ORDER — TRAMADOL HCL 50 MG PO TABS
50.0000 mg | ORAL_TABLET | Freq: Once | ORAL | Status: AC
  Administered 2017-01-28: 50 mg via ORAL
  Filled 2017-01-28: qty 1

## 2017-01-28 MED ORDER — TRAMADOL HCL 50 MG PO TABS: 50.0000 mg | ORAL_TABLET | Freq: Four times a day (QID) | ORAL | 0 refills | Status: AC | PRN

## 2017-01-28 MED ORDER — PREDNISONE 20 MG PO TABS
60.0000 mg | ORAL_TABLET | Freq: Once | ORAL | Status: AC
  Administered 2017-01-28: 60 mg via ORAL
  Filled 2017-01-28: qty 3

## 2017-01-28 MED ORDER — PREDNISONE 10 MG PO TABS: 20.0000 mg | ORAL_TABLET | Freq: Every day | ORAL | 0 refills | Status: AC

## 2017-01-28 NOTE — ED Triage Notes (Signed)
Pt c/o hypertension and headaches x several days. Pt developed chest pain at approximately 12pm, pt states pain in L chest and radiating into L shoulder, pt also c/o L arm numbness. Pt c/o dyspnea on exertion. Pt has taken an Advil Migraine at 8am.

## 2017-01-28 NOTE — Discharge Instructions (Signed)
Follow-up with your family doctor next week for recheck. 

## 2017-01-28 NOTE — ED Provider Notes (Signed)
[facility]-EMERGENCY DEPT Provider Note   CSN: [id number] Arrival date & time: 01/28/17  1339     History   Chief Complaint Chief Complaint  Patient presents with  . Chest Pain    HPI [name] is a 46 y.o. female.  Patient complains of left-sided upper chest pain also some numbness in her left hand with some center hand.   The history is provided by the patient.  Chest Pain   This is a new problem. The current episode started 6 to 12 hours ago. The problem occurs constantly. The problem has not changed since onset.The pain is associated with movement. The pain is present in the lateral region. The pain is at a severity of 3/10. The pain is moderate. The quality of the pain is described as burning. The pain does not radiate. Pertinent negatives include no abdominal pain, no back pain, no cough and no headaches.  Pertinent negatives for past medical history include no seizures.    History reviewed. No pertinent past medical history.  Patient Active Problem List   Diagnosis Date Noted  . Contraceptive management 10/09/2015  . Seasonal and perennial allergic rhinitis 10/06/2015  . Legally [place] in left eye, [place] in [place] 10/06/2015  . BMI 28.0-28.9,adult 10/06/2015  . Elevated blood pressure (not hypertension) 10/06/2015  . Former Smoker- 15 pack yr 06/18/2012    Past Surgical History:  Procedure Laterality Date  . CHOLECYSTECTOMY  2010    OB History    Gravida Para Term Preterm AB Living   2   0   2 0   SAB TAB Ectopic Multiple Live Births   2               Home Medications    Prior to Admission medications   Medication Sig Start Date End Date Taking? Authorizing Provider  cetirizine (ZYRTEC) 10 MG tablet Take 10 mg by mouth at bedtime.   Yes [provider]  Ibuprofen (ADVIL MIGRAINE) 200 MG CAPS Take 2 capsules by mouth daily as needed (migraine).   Yes [provider]  fluticasone (FLONASE) 50 MCG/ACT nasal  spray Place 2 sprays into both nostrils daily. Patient not taking: Reported on 01/28/2017 09/29/13   Jonita AlbeeGuest, Chris W, MD  Multiple Vitamin (MULTIVITAMIN) tablet Take 1 tablet by mouth daily.    [provider]  phentermine (ADIPEX-P) 37.5 MG tablet  12/08/16   [provider]  predniSONE (DELTASONE) 10 MG tablet Take 2 tablets (20 mg total) by mouth daily. 01/28/17   Bethann BerkshireZammit, Aerianna Losey, MD    Family History Family History  Problem Relation Age of Onset  . Breast cancer Mother   . Hypertension Mother   . Heart attack Mother   . Hyperlipidemia Mother   . Prostate cancer Father   . Depression Father   . Hypertension Brother   . Hypertension Maternal Grandmother   . Stroke Maternal Grandmother   . Hypertension Maternal Grandfather   . Breast cancer Paternal Grandmother   . Diabetes Paternal Grandmother     Social History Social History   Tobacco Use  . Smoking status: Former Smoker    Packs/day: 1.00    Years: 15.00    Pack years: 15.00    Types: Cigarettes    Last attempt to quit: 01/29/2014    Years since quitting: 3.0  . Smokeless tobacco: Never Used  Substance Use Topics  . Alcohol use: No  . Drug use: No     Allergies  Dilaudid [hydromorphone hcl]   Review of Systems Review of Systems  Constitutional: Negative for appetite change and fatigue.  HENT: Negative for congestion, ear discharge and sinus pressure.   Eyes: Negative for discharge.  Respiratory: Negative for cough.   Cardiovascular: Positive for chest pain.  Gastrointestinal: Negative for abdominal pain and diarrhea.  Genitourinary: Negative for frequency and hematuria.  Musculoskeletal: Negative for back pain.  Skin: Negative for rash.  Neurological: Negative for seizures and headaches.       Weakness to left hand with numbness  Psychiatric/Behavioral: Negative for hallucinations.     Physical Exam Updated Vital Signs BP 138/78   Pulse 60   Temp 98.1 F (36.7 C) (Oral)   Resp 13    Ht 5\' 2"  (1.575 m)   Wt 78.5 kg (173 lb)   SpO2 97%   BMI 31.64 kg/m   Physical Exam  Constitutional: She is oriented to person, place, and time. She appears well-developed.  HENT:  Head: Normocephalic.  Eyes: Conjunctivae and EOM are normal. No scleral icterus.  Neck: Neck supple. No thyromegaly present.  Cardiovascular: Normal rate and regular rhythm. Exam reveals no gallop and no friction rub.  No murmur heard. Pulmonary/Chest: No stridor. She has no wheezes. She has no rales. She exhibits no tenderness.  Abdominal: She exhibits no distension. There is no tenderness. There is no rebound.  Musculoskeletal: Normal range of motion. She exhibits no edema.  Lymphadenopathy:    She has no cervical adenopathy.  Neurological: She is oriented to person, place, and time. She exhibits normal muscle tone. Coordination normal.  Mild weakness of left arm and hand  Skin: No rash noted. No erythema.  Psychiatric: She has a normal mood and affect. Her behavior is normal.     ED Treatments / Results  Labs (all labs ordered are listed, but only abnormal results are displayed) Labs Reviewed  BASIC METABOLIC PANEL - Abnormal; Notable for the following components:      Result Value   Potassium 3.0 (*)    Glucose, Bld 126 (*)    All other components within normal limits  CBC  I-STAT TROPONIN, ED  I-STAT TROPONIN, ED    EKG  EKG Interpretation  Date/Time:  Monday January 28 2017 13:50:23 EST Ventricular Rate:  78 PR Interval:    QRS Duration: 76 QT Interval:  353 QTC Calculation: 402 R Axis:   68 Text Interpretation:  Sinus rhythm Borderline repolarization abnormality Confirmed by Bethann BerkshireZammit, Rashida Ladouceur [phone]([id number]) on 01/28/2017 5:51:03 PM       Radiology Dg Chest 2 View  Result Date: 01/28/2017 CLINICAL DATA:  Chest pain EXAM: CHEST  2 VIEW COMPARISON:  01/25/2013 FINDINGS: The lungs are clear without focal pneumonia, edema, pneumothorax or pleural effusion. The cardiopericardial  silhouette is within normal limits for size. The visualized bony structures of the thorax are intact. Nodular density/densities projecting over the lungs are compatible with pads for telemetry leads. IMPRESSION: No active cardiopulmonary disease. Electronically Signed   By: Kennith CenterEric  Mansell M.D.   On: 01/28/2017 15:03   Ct Head Wo Contrast  Result Date: 01/28/2017 CLINICAL DATA:  Severe headache for several days. Left arm numbness. Hypertension. Focal neuro deficit, < 6 hrs, stroke suspected EXAM: CT HEAD WITHOUT CONTRAST TECHNIQUE: Contiguous axial images were obtained from the base of the skull through the vertex without intravenous contrast. COMPARISON:  None. FINDINGS: Brain: No evidence of acute infarction, hemorrhage, hydrocephalus, extra-axial collection, or mass lesion/mass effect. Vascular:  No hyperdense vessel or other acute  findings. Skull: No evidence of fracture or other significant bone abnormality. Sinuses/Orbits:  No acute findings. Other: None. IMPRESSION: Negative noncontrast head CT. Electronically Signed   By: Myles Rosenthal M.D.   On: 01/28/2017 18:29   Mr Brain Wo Contrast  Result Date: 01/28/2017 CLINICAL DATA:  Initial evaluation for acute headache, elevated blood pressure for several days. EXAM: MRI HEAD WITHOUT CONTRAST TECHNIQUE: Multiplanar, multiecho pulse sequences of the brain and surrounding structures were obtained without intravenous contrast. COMPARISON:  Prior CT from earlier the same day. FINDINGS: Brain: Cerebral volume within normal limits for patient age. No focal parenchymal signal abnormality identified. No abnormal foci of restricted diffusion to suggest acute or subacute ischemia. Gray-white matter differentiation well maintained. No encephalomalacia to suggest chronic infarction. No foci of susceptibility artifact to suggest acute or chronic intracranial hemorrhage. No mass lesion, midline shift or mass effect. No hydrocephalus. No extra-axial fluid collection. Major  dural sinuses are grossly patent. Pituitary gland and suprasellar region are normal. Midline structures intact and normal. Vascular: Major intracranial vascular flow voids well maintained and normal in appearance. Skull and upper cervical spine: Craniocervical junction normal. Visualized upper cervical spine within normal limits. Bone marrow signal intensity normal. No scalp soft tissue abnormality. Sinuses/Orbits: Globes and orbital soft tissues within normal limits. Paranasal sinuses are clear. No mastoid effusion. Inner ear structures normal. Other: None. IMPRESSION: Normal brain MRI.  No acute intracranial abnormality. Electronically Signed   By: Rise Mu M.D.   On: 01/28/2017 20:41    Procedures Procedures (including critical care time)  Medications Ordered in ED Medications  traMADol (ULTRAM) tablet 50 mg (not administered)  predniSONE (DELTASONE) tablet 60 mg (not administered)     Initial Impression / Assessment and Plan / ED Course  I have reviewed the triage vital signs and the nursing notes.  Pertinent labs & imaging results that were available during my care of the patient were reviewed by me and considered in my medical decision making (see chart for details).    Patient's labs CT scan and MRI were negative doubt patient having coronary artery disease and CVA has been ruled out.  Suspect cervical nerve problem.  Patient will be placed on prednisone and Ultram and will follow up with her PCP   Final Clinical Impressions(s) / ED Diagnoses   Final diagnoses:  Atypical chest pain    ED Discharge Orders        Ordered    predniSONE (DELTASONE) 10 MG tablet  Daily     01/28/17 2250       Bethann Berkshire, MD 01/28/17 2257

## 2019-08-03 IMAGING — MR MR HEAD W/O CM
10 series · 41 of 48 positions shown · non-contrast
Comparison: Prior CT from earlier the same day.

CLINICAL DATA: Initial evaluation for acute headache, elevated
blood pressure for several days.

EXAM:
MRI HEAD WITHOUT CONTRAST
TECHNIQUE: Multiplanar, multiecho pulse sequences of the brain and surrounding
structures were obtained without intravenous contrast.

[Series 3: T1 · sagittal · 5.0mm · 0.47mm/px · 2 of 22 slices shown]
[im 1/22]
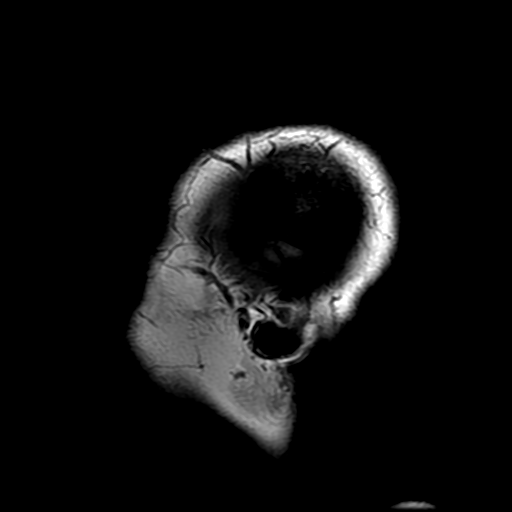
[im 22/22]
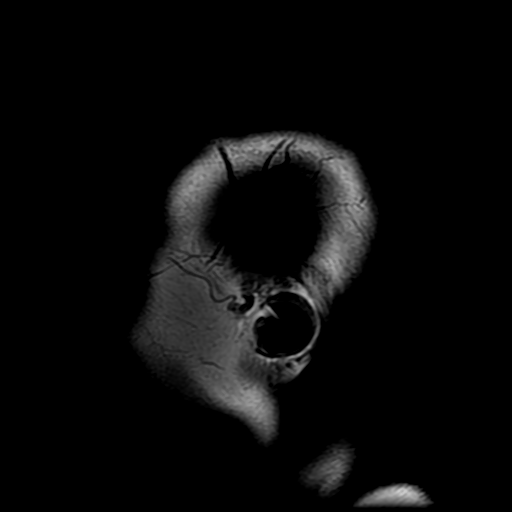

[Series 4: DWI · axial · 3.0mm · 1.09mm/px · z∈[-55,+83]mm · 8 of 94 slices shown (1 of 4)]
[im 1/94]
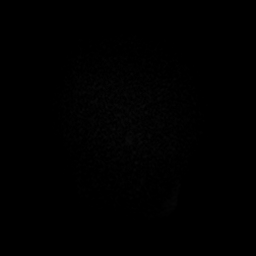
[im 11/94]
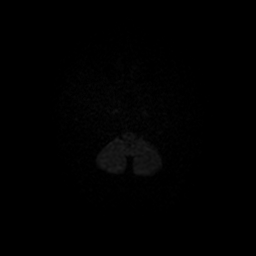
[im 32/94]
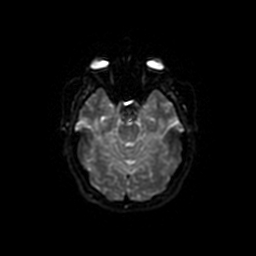
[im 42/94]
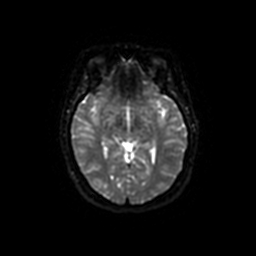
[im 52/94]
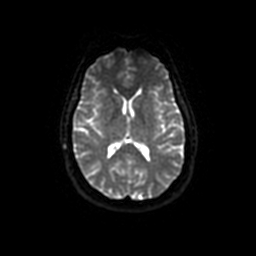
[im 63/94]
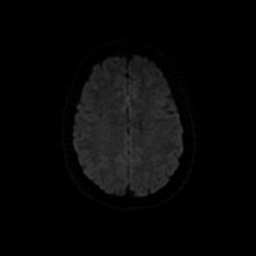
[im 83/94]
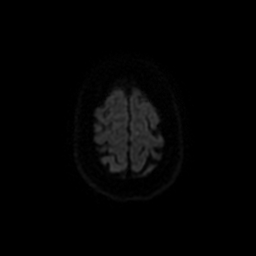
[im 94/94]
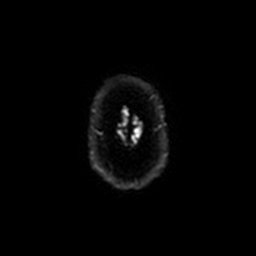

[Series 5: DWI · coronal · 5.0mm · 1.09mm/px · 8 of 68 slices shown (2 of 4)]
[im 1/68]
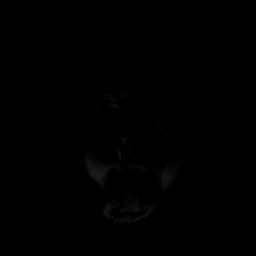
[im 10/68]
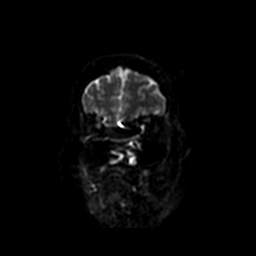
[im 20/68]
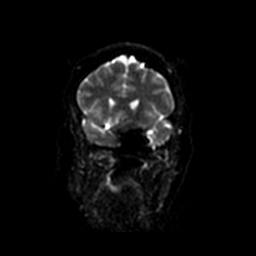
[im 29/68]
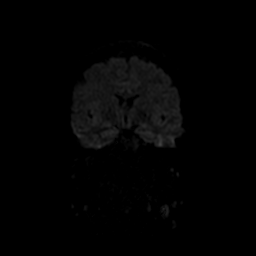
[im 39/68]
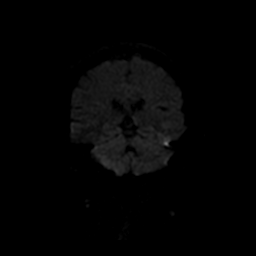
[im 48/68]
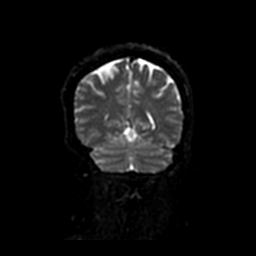
[im 58/68]
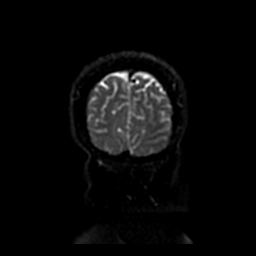
[im 68/68]
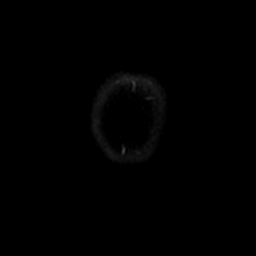

[Series 6: T2 · axial · 5.0mm · 0.86mm/px · z∈[-56,+84]mm · 3 of 21 slices shown (1 of 2)]
[im 1/21]
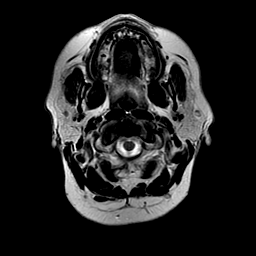
[im 11/21]
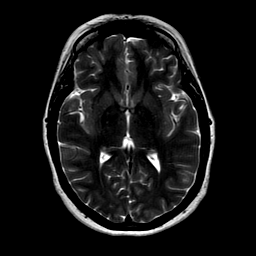
[im 21/21]
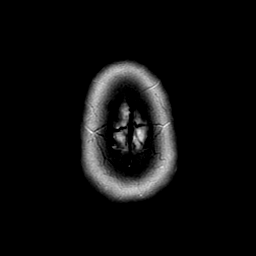

[Series 7: FLAIR · axial · 5.0mm · 0.43mm/px · z∈[-56,+84]mm · 3 of 21 slices shown]
[im 1/21]
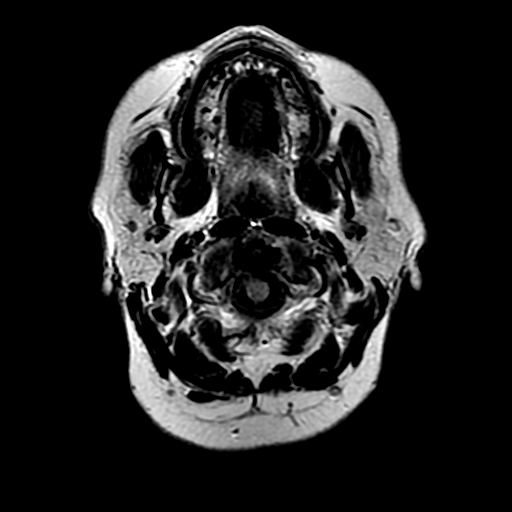
[im 11/21]
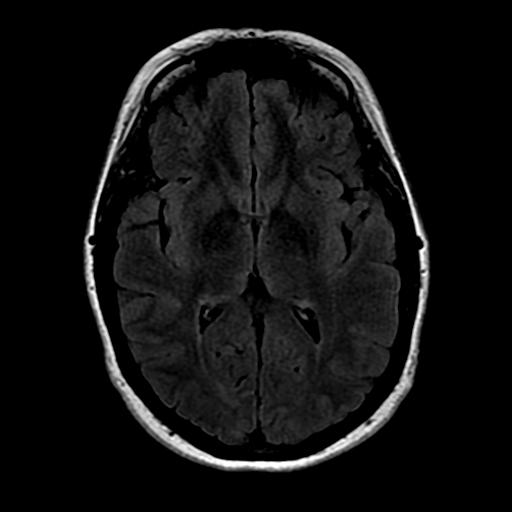
[im 21/21]
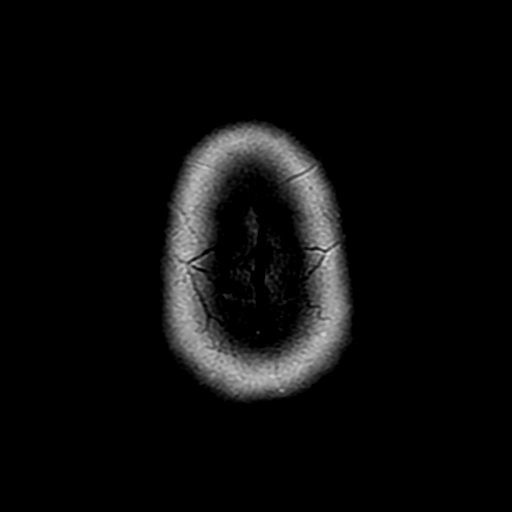

[Series 8: ax mpgr · axial · 5.0mm · 0.43mm/px · z∈[-56,+84]mm · 3 of 21 slices shown]
[im 1/21]
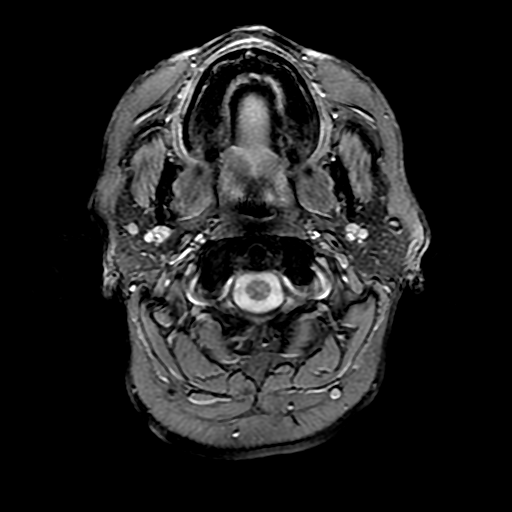
[im 11/21]
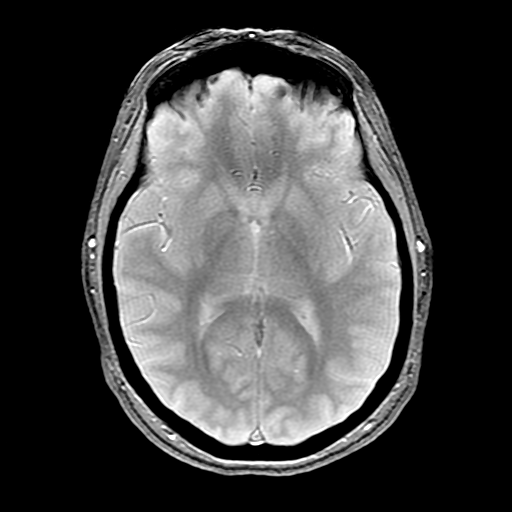
[im 21/21]
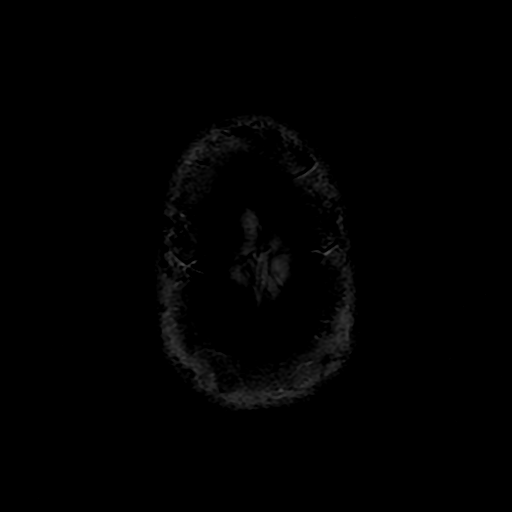

[Series 9: ax fspgr irp · axial · 3.0mm · 0.47mm/px · 1 of 46 slices shown]
[im 1/46]
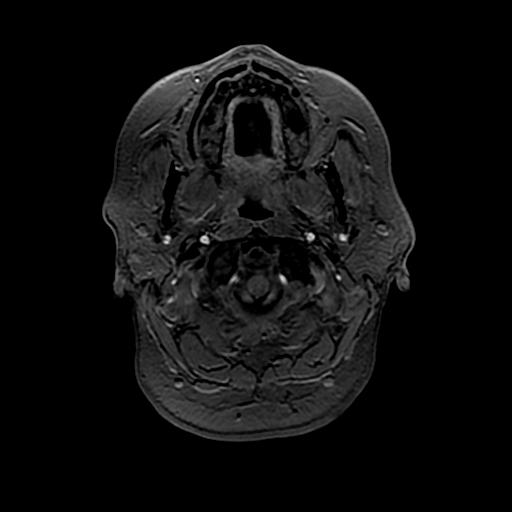

[Series 10: T2 · coronal · 5.0mm · 0.90mm/px · 3 of 26 slices shown (2 of 2)]
[im 1/26]
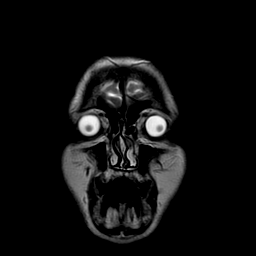
[im 13/26]
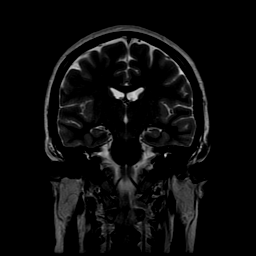
[im 26/26]
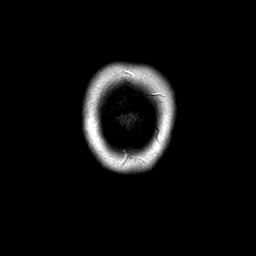

[Series 400: DWI · axial · 3.0mm · 1.09mm/px · z∈[-55,+83]mm · 6 of 47 slices shown (3 of 4)]
[im 1/47]
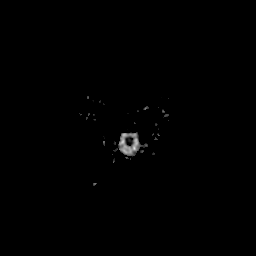
[im 10/47]
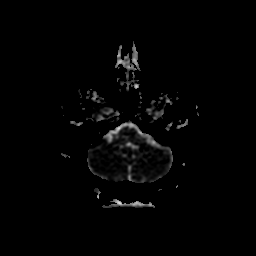
[im 19/47]
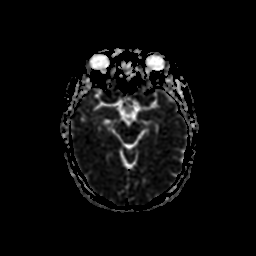
[im 28/47]
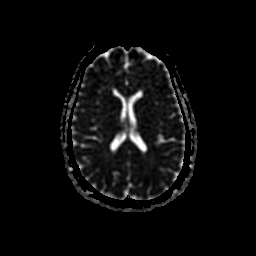
[im 37/47]
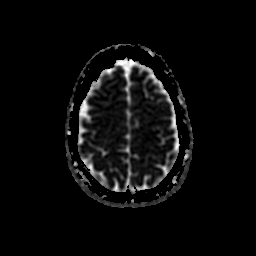
[im 47/47]
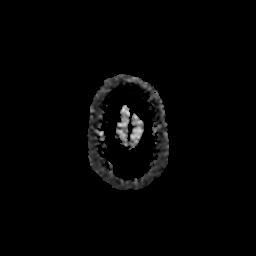

[Series 500: DWI · coronal · 5.0mm · 1.09mm/px · 4 of 34 slices shown (4 of 4)]
[im 1/34]
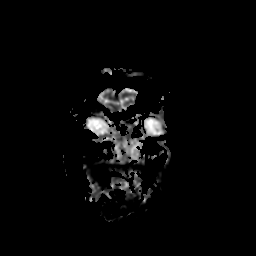
[im 12/34]
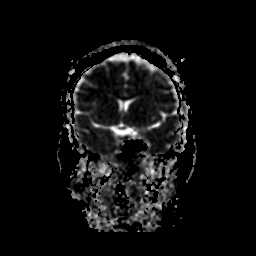
[im 23/34]
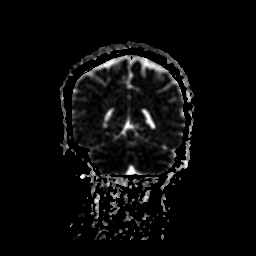
[im 34/34]
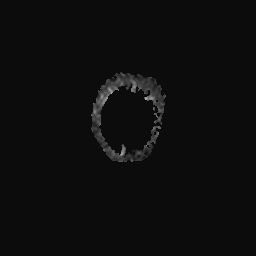

[41 of 48 positions shown; findings below may reference images not displayed]

FINDINGS: Brain: Cerebral volume within normal limits for patient age. No
focal parenchymal signal abnormality identified. No abnormal foci of
restricted diffusion to suggest acute or subacute ischemia.
Gray-white matter differentiation well maintained. No
encephalomalacia to suggest chronic infarction. No foci of
susceptibility artifact to suggest acute or chronic intracranial
hemorrhage.

No mass lesion, midline shift or mass effect. No hydrocephalus. No
extra-axial fluid collection. Major dural sinuses are grossly
patent.

Pituitary gland and suprasellar region are normal. Midline
structures intact and normal.

Vascular: Major intracranial vascular flow voids well maintained and
normal in appearance.

Skull and upper cervical spine: Craniocervical junction normal.
Visualized upper cervical spine within normal limits. Bone marrow
signal intensity normal. No scalp soft tissue abnormality.

Sinuses/Orbits: Globes and orbital soft tissues within normal
limits. Paranasal sinuses are clear. No mastoid effusion. Inner ear
structures normal.

Other: None.
IMPRESSION: Normal brain MRI.  No acute intracranial abnormality.

## 2019-10-10 ENCOUNTER — Emergency Department (HOSPITAL_COMMUNITY): Payer: PRIVATE HEALTH INSURANCE

## 2019-10-10 ENCOUNTER — Encounter: Payer: Self-pay | Admitting: Emergency Medicine

## 2019-10-10 ENCOUNTER — Ambulatory Visit
Admission: EM | Admit: 2019-10-10 | Discharge: 2019-10-10 | Disposition: A | Discharge status: Home / Self Care | Payer: PRIVATE HEALTH INSURANCE | Attending: Emergency Medicine | Admitting: Emergency Medicine

## 2019-10-10 ENCOUNTER — Other Ambulatory Visit: Payer: Self-pay

## 2019-10-10 ENCOUNTER — Emergency Department (HOSPITAL_COMMUNITY)
Admission: EM | Admit: 2019-10-10 | Discharge: 2019-10-11 | Disposition: A | Discharge status: Home / Self Care | Payer: PRIVATE HEALTH INSURANCE | Attending: Emergency Medicine | Admitting: Emergency Medicine

## 2019-10-10 ENCOUNTER — Encounter (HOSPITAL_COMMUNITY): Payer: Self-pay | Admitting: Emergency Medicine

## 2019-10-10 DIAGNOSIS — R42 Dizziness and giddiness: Secondary | ICD-10-CM | POA: Diagnosis not present

## 2019-10-10 DIAGNOSIS — R112 Nausea with vomiting, unspecified: Secondary | ICD-10-CM | POA: Insufficient documentation

## 2019-10-10 DIAGNOSIS — R9431 Abnormal electrocardiogram [ECG] [EKG]: Secondary | ICD-10-CM

## 2019-10-10 DIAGNOSIS — Z87891 Personal history of nicotine dependence: Secondary | ICD-10-CM | POA: Diagnosis not present

## 2019-10-10 DIAGNOSIS — R001 Bradycardia, unspecified: Secondary | ICD-10-CM | POA: Insufficient documentation

## 2019-10-10 DIAGNOSIS — Z8249 Family history of ischemic heart disease and other diseases of the circulatory system: Secondary | ICD-10-CM

## 2019-10-10 DIAGNOSIS — R1012 Left upper quadrant pain: Secondary | ICD-10-CM | POA: Diagnosis not present

## 2019-10-10 DIAGNOSIS — R519 Headache, unspecified: Secondary | ICD-10-CM | POA: Diagnosis not present

## 2019-10-10 DIAGNOSIS — M25512 Pain in left shoulder: Secondary | ICD-10-CM | POA: Diagnosis not present

## 2019-10-10 DIAGNOSIS — R079 Chest pain, unspecified: Secondary | ICD-10-CM

## 2019-10-10 DIAGNOSIS — M542 Cervicalgia: Secondary | ICD-10-CM | POA: Diagnosis not present

## 2019-10-10 LAB — CBC
HCT: 49.1 % — ABNORMAL HIGH (ref 36.0–46.0)
Hemoglobin: 15.4 g/dL — ABNORMAL HIGH (ref 12.0–15.0)
MCH: 30.2 pg (ref 26.0–34.0)
MCHC: 31.4 g/dL (ref 30.0–36.0)
MCV: 96.3 fL (ref 80.0–100.0)
Platelets: 158 10*3/uL (ref 150–400)
RBC: 5.1 MIL/uL (ref 3.87–5.11)
RDW: 13.6 % (ref 11.5–15.5)
WBC: 12.9 10*3/uL — ABNORMAL HIGH (ref 4.0–10.5)
nRBC: 0 % (ref 0.0–0.2)

## 2019-10-10 LAB — BASIC METABOLIC PANEL
Anion gap: 11 (ref 5–15)
BUN: 9 mg/dL (ref 6–20)
CO2: 23 mmol/L (ref 22–32)
Calcium: 9.5 mg/dL (ref 8.9–10.3)
Chloride: 106 mmol/L (ref 98–111)
Creatinine, Ser: 0.92 mg/dL (ref 0.44–1.00)
GFR calc Af Amer: >60 mL/min (ref >60)
GFR calc non Af Amer: >60 mL/min (ref >60)
Glucose, Bld: 89 mg/dL (ref 70–99)
Potassium: 4.2 mmol/L (ref 3.5–5.1)
Sodium: 140 mmol/L (ref 135–145)

## 2019-10-10 LAB — HEPATIC FUNCTION PANEL
ALT: 76 U/L — ABNORMAL HIGH (ref 0–44)
AST: 23 U/L (ref 15–41)
Albumin: 3.5 g/dL (ref 3.5–5.0)
Alkaline Phosphatase: 70 U/L (ref 38–126)
Bilirubin, Direct: <0.1 mg/dL (ref 0.0–0.2)
Total Bilirubin: 0.3 mg/dL (ref 0.3–1.2)
Total Protein: 6.4 g/dL — ABNORMAL LOW (ref 6.5–8.1)

## 2019-10-10 LAB — TROPONIN I (HIGH SENSITIVITY)
Troponin I (High Sensitivity): 4 ng/L (ref <18)
Troponin I (High Sensitivity): 4 ng/L (ref <18)

## 2019-10-10 LAB — LIPASE, BLOOD: Lipase: 32 U/L (ref 11–51)

## 2019-10-10 MED ORDER — SUCRALFATE 1 GM/10ML PO SUSP: 1.0000 g | Freq: Three times a day (TID) | ORAL | 0 refills | Status: AC

## 2019-10-10 MED ORDER — SODIUM CHLORIDE 0.9 % IV BOLUS
1000.0000 mL | Freq: Once | INTRAVENOUS | Status: AC
  Administered 2019-10-10: 1000 mL via INTRAVENOUS

## 2019-10-10 MED ORDER — ONDANSETRON 4 MG PO TBDP: 4.0000 mg | ORAL_TABLET | Freq: Three times a day (TID) | ORAL | 0 refills | Status: AC | PRN

## 2019-10-10 MED ORDER — METOCLOPRAMIDE HCL 5 MG/ML IJ SOLN
10.0000 mg | Freq: Once | INTRAMUSCULAR | Status: AC
  Administered 2019-10-10: 10 mg via INTRAVENOUS
  Filled 2019-10-10: qty 2

## 2019-10-10 MED ORDER — ALUM & MAG HYDROXIDE-SIMETH 200-200-20 MG/5ML PO SUSP
30.0000 mL | Freq: Once | ORAL | Status: AC
  Administered 2019-10-10: 30 mL via ORAL
  Filled 2019-10-10: qty 30

## 2019-10-10 MED ORDER — LIDOCAINE 5 % EX PTCH: 1.0000 | MEDICATED_PATCH | Freq: Every day | CUTANEOUS | 0 refills | Status: AC | PRN

## 2019-10-10 MED ORDER — ONDANSETRON HCL 4 MG/2ML IJ SOLN: 4.0000 mg | Freq: Once | INTRAMUSCULAR | Status: DC

## 2019-10-10 MED ORDER — LIDOCAINE 5 % EX PTCH
1.0000 | MEDICATED_PATCH | CUTANEOUS | Status: DC
  Administered 2019-10-10: 1 via TRANSDERMAL
  Filled 2019-10-10: qty 1

## 2019-10-10 MED ORDER — DIPHENHYDRAMINE HCL 50 MG/ML IJ SOLN
12.5000 mg | Freq: Once | INTRAMUSCULAR | Status: AC
  Administered 2019-10-10: 12.5 mg via INTRAVENOUS
  Filled 2019-10-10: qty 1

## 2019-10-10 NOTE — ED Triage Notes (Signed)
Pt here for CP with radiation to left shoulder and in the middle of her back starting last night; pt sts some N/V with some abd pain as well

## 2019-10-10 NOTE — ED Triage Notes (Signed)
Pt. Stated, Im having chest pain with N/V and dizziness.  And a headache.

## 2019-10-10 NOTE — Discharge Instructions (Addendum)
You were seen in the emergency department today for chest pain. Your work-up in the emergency department has been overall reassuring. Your labs have been fairly normal and or similar to previous blood work you have had done, one of your liver function test was mildly elevated, please have this rechecked by primary care provider.. Your EKG and the enzyme we use to check your heart did not show an acute heart attack at this time. Your chest x-ray was normal.   We are sending you home with the following medicines to try to help with your symptoms: -Zofran: Take every 8 hours as needed for nausea or vomiting -Carafate: Take prior to meals and prior to bedtime to help with upper abdominal/lower chest discomfort. -Lidoderm patch: Apply 1 patch to your face with significant pain in your shoulder once per day help numb/soothe the area.  You may apply heat to this area as well.  Please take Motrin per over-the-counter dosing to help with discomfort as well.  We have prescribed you new medication(s) today. Discuss the medications prescribed today with your pharmacist as they can have adverse effects and interactions with your other medicines including over the counter and prescribed medications. Seek medical evaluation if you start to experience new or abnormal symptoms after taking one of these medicines, seek care immediately if you start to experience difficulty breathing, feeling of your throat closing, facial swelling, or rash as these could be indications of a more serious allergic reaction   We would like you to follow up closely with your primary care provider and/or the cardiologist provided in your discharge instructions within 1-3 days. Return to the ER immediately should you experience any new or worsening symptoms including but not limited to return of pain, worsened pain, vomiting, shortness of breath, dizziness, lightheadedness, passing out, or any other concerns that you may have.

## 2019-10-10 NOTE — ED Provider Notes (Signed)
[facility] EMERGENCY DEPARTMENT Provider Note   CSN: [id number] Arrival date & time: 10/10/19  1000     History Chief Complaint  Patient presents with  . Chest Pain    Carmen Chaney is a 49 y.o. female with a history of prior cholecystectomy who presents to the ED from urgent care for evaluation of chest pain that began last night. Patient states pain began shortly after she returned home from going out to dinner, she was at rest with onset, discomfort started under her L breast/LUQ of the abdomen and seemed to radiate into the L shoulder area with nausea & 3 episodes of emesis last night. This AM now has a dull constant pain in the L shoulder that radiates into the LUE with intermittent sharp pain to the LUQ/below L breast that radiates into the epigastrium a couple of times per hour lasting a few seconds at a time. With sharp pain episodes she does get a bit nauseated& lightheaded- equivlates this somewhat to when you feel so hungry you are almost nauseated, has not had anything to eat this AM. No specific triggers or alleviating/aggravating factors. She denies dyspnea, syncope, leg pain/swelling, hemoptysis, recent surgery/trauma, recent long travel, hormone use, personal hx of cancer, or hx of DVT/PE. She relays her mother had her first heart attack in her early thirties which she thinks was due to stress. She has had increased stress recently. She denies personal hx of CAD. 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      HPI     History reviewed. No pertinent past medical history.  Patient Active Problem List   Diagnosis Date Noted  . Contraceptive management 10/09/2015  . Seasonal and perennial allergic rhinitis 10/06/2015  . Legally blind in left eye, as  defined in [place] 10/06/2015  . BMI 28.0-28.9,adult 10/06/2015  . Elevated blood pressure (not hypertension) 10/06/2015  . Former Smoker- 15 pack yr 06/18/2012    Past Surgical History:  Procedure Laterality Date  . CHOLECYSTECTOMY  2010     OB History    Gravida  2   Para      Term  0   Preterm      AB  2   Living  0     SAB  2   TAB      Ectopic      Multiple      Live Births              Family History  Problem Relation Age of Onset  . Breast cancer Mother   . Hypertension Mother   . Heart attack Mother   . Hyperlipidemia Mother   . Prostate cancer Father   . Depression Father   . Hypertension Brother   . Hypertension Maternal Grandmother   . Stroke Maternal Grandmother   . Hypertension Maternal Grandfather   . Breast cancer Paternal Grandmother   . Diabetes Paternal Grandmother     Social History   Tobacco Use  . Smoking status: Former Smoker    Packs/day: 1.00    Years: 15.00    Pack years: 15.00    Types: Cigarettes    Quit date: 01/29/2014    Years since quitting: 5.6  . Smokeless tobacco: Never Used  Substance Use Topics  . Alcohol use: No  . Drug use: No    Home Medications Prior to Admission medications   Medication Sig Start Date End Date Taking? Authorizing Provider  cetirizine (ZYRTEC) 10 MG tablet Take 10 mg by mouth at bedtime.    [provider]  fluticasone (FLONASE) 50 MCG/ACT nasal spray Place 2 sprays into both nostrils daily. Patient not taking: Reported on 01/28/2017 09/29/13   Jonita AlbeeGuest, Chris W, MD  Ibuprofen (ADVIL MIGRAINE) 200 MG CAPS Take 2 capsules by mouth daily as needed (migraine).    [provider]  Multiple Vitamin (MULTIVITAMIN) tablet Take 1 tablet by mouth daily.    [provider]  phentermine (ADIPEX-P) 37.5 MG tablet  12/08/16   [provider]  predniSONE (DELTASONE) 10 MG tablet Take 2 tablets (20 mg total) by mouth daily. Patient not taking: Reported on 10/10/2019  01/28/17   Bethann BerkshireZammit, Joseph, MD  traMADol (ULTRAM) 50 MG tablet Take 1 tablet (50 mg total) by mouth every 6 (six) hours as needed. Patient not taking: Reported on 10/10/2019 01/28/17   Bethann BerkshireZammit, Joseph, MD    Allergies    Dilaudid [hydromorphone hcl]  Review of Systems   Review of Systems  Constitutional: Negative for chills and fever.  Respiratory: Negative for cough and shortness of breath.   Cardiovascular: Positive for chest pain. Negative for leg swelling.  Gastrointestinal: Positive for abdominal pain, nausea and vomiting. Negative for blood in stool, constipation and diarrhea.  Genitourinary: Negative for dysuria.  Musculoskeletal: Positive for arthralgias and neck pain.  Neurological: Positive for light-headedness and headaches. Negative for seizures, syncope, weakness and numbness.  All other systems reviewed and are negative.   Physical Exam Updated Vital Signs BP 120/68 (BP Location: Right Arm)   Pulse (!) 50   Temp 98.3 F (36.8 C) (Oral)   Resp 16   Ht 5\' 3"  (1.6 m)   Wt 87.5 kg   SpO2 95%   BMI 34.19 kg/m   Physical Exam Vitals and nursing note reviewed.  Constitutional:      General: She is not in acute distress.    Appearance: Normal appearance. She is well-developed. She is not ill-appearing or toxic-appearing.  HENT:     Head: Normocephalic and atraumatic.  Eyes:     General:        Right eye: No discharge.        Left eye: No discharge.     Conjunctiva/sclera: Conjunctivae normal.  Neck:     Comments: No midline tenderness.  Cardiovascular:     Rate and Rhythm: Regular rhythm. Bradycardia present.     Pulses:          Radial pulses are 2+ on the right side and 2+ on the left side.  Pulmonary:     Effort: Pulmonary effort is normal. No respiratory distress.     Breath sounds: Normal breath sounds. No wheezing, rhonchi or rales.  Chest:     Chest wall: Tenderness (left lower chest wall) present.  Abdominal:     General: There is no distension.      Palpations: Abdomen is soft.     Tenderness: There is abdominal tenderness (mild epigastrium & LUQ). There is no guarding or rebound.  Musculoskeletal:     Cervical back: Normal range of motion and neck supple.     Right lower leg: No tenderness. No edema.     Left lower leg: No tenderness. No edema.     Comments: Upper extremities:  No obvious deformity, appreciable swelling, edema, erythema, ecchymosis, warmth, or open wounds. Patient has intact AROM throughout- pain in the shoulder/LUE occurs with left shoulder abduction. No focal bony tenderness to palpation. .   Skin:    General: Skin is warm and dry.     Capillary Refill: Capillary refill takes less than 2 seconds.     Findings: No rash.  Neurological:     Mental Status: She is alert.     Comments: Alert. Clear speech.  CN III through XII grossly intact.  Sensation grossly intact to bilateral upper extremities. 5/5 symmetric grip strength. Ambulatory.   Psychiatric:        Mood and Affect: Mood normal.        Behavior: Behavior normal.     ED Results / Procedures / Treatments   Labs (all labs ordered are listed, but only abnormal results are displayed) Labs Reviewed  CBC - Abnormal; Notable for the following components:      Result Value   WBC 12.9 (*)    Hemoglobin 15.4 (*)    HCT 49.1 (*)    All other components within normal limits  BASIC METABOLIC PANEL  LIPASE, BLOOD  TROPONIN I (HIGH SENSITIVITY)  TROPONIN I (HIGH SENSITIVITY)    EKG EKG Interpretation  Date/Time:  Saturday October 10 2019 10:19:57 EDT Ventricular Rate:  45 PR Interval:  110 QRS Duration: 78 QT Interval:  442 QTC Calculation: 382 R Axis:   71 Text Interpretation: Sinus bradycardia with short PR Nonspecific ST abnormality Abnormal ECG Confirmed by Tilden Fossa [phone]) on 10/10/2019 7:04:42 PM   Radiology DG Chest 2 View  Result Date: 10/10/2019 CLINICAL DATA:  Chest pain EXAM: CHEST - 2 VIEW COMPARISON:  January 28, 2017 FINDINGS:  Lungs are clear. Heart size and pulmonary vascularity are normal. No adenopathy. No pneumothorax. No bone lesions. IMPRESSION: Lungs clear.  Cardiac silhouette normal. Electronically Signed   By: Bretta Bang III M.D.   On: 10/10/2019 10:56    Procedures Procedures (including critical care time)  Medications Ordered in ED Medications  lidocaine (LIDODERM) 5 % 1 patch (1 patch Transdermal Patch Applied 10/10/19 1655)  alum & mag hydroxide-simeth (MAALOX/MYLANTA) 200-200-20 MG/5ML suspension 30 mL (30 mLs Oral Given 10/10/19 1746)  sodium chloride 0.9 % bolus 1,000 mL (0 mLs Intravenous Stopped 10/10/19 1810)  metoCLOPramide (REGLAN) injection 10 mg (10 mg Intravenous Given 10/10/19 1657)  diphenhydrAMINE (BENADRYL) injection 12.5 mg (12.5 mg Intravenous Given 10/10/19 1659)    ED Course  I have reviewed the triage vital signs and the nursing notes.  Pertinent labs & imaging results that were available during my care of the patient were reviewed by me and considered in my medical decision making (see chart for details).    MDM Rules/Calculators/A&P                         Patient presents to the emergency department with chest pain. Patient nontoxic appearing, in no apparent distress, vitals without significant abnormality- noted to be bradycardic. LUE pain seems to be reproduced with ROM of the L shoulder in terms of her dull constant pain. Has some tenderness to the left lower anterior chest & epigastric/LUQ of the abdomen which seems to reproduce her sharp episodes of pain. No peritoneal signs. Exam otherwise is unremarkable.   DDX: ACS, pulmonary embolism, dissection, pneumothorax, pneumonia, arrhythmia, severe anemia, MSK, GERD/PUD, pancreatitis, hepatitis, stress/anxiety. She is S/p cholecystectomy.   Additional history obtained:  Additional history obtained from chart review & nursing note review.  Previous records obtained and reviewed.   EKG: No STEMI, non specific ST changes.    Lab Tests:  I reviewed and interpreted labs, which included:  CBC, BMP, lipase, initial troponin- unremarkable.  I have ordered delta troponin & hepatic function panel: troponin WNL, hepatic function panel w/ mild elevation in ALT, otherwise unremarkable.   Imaging Studies ordered:  CXR ordered per traige, I independently visualized and interpreted imaging which showed no acute process.   HEAR score utilizing heart pathway is low risk- 3- EKG with nonspecific ST changes, no STEMI, delta troponin negative, low suspicion for ACS. Patient is low risk wells, PERC negative, doubt pulmonary embolism. Pain is not a tearing sensation, symmetric pulses, no widening of mediastinum on CXR, doubt dissection. Repeat abdominal exam remains without peritoneal signs- doubt acute surgical process. Patient given lidoderm patch to help with shoulder pain, migraine cocktail to help with head/neck pain that has been occurring for a little while, and Gi cocktail to help with upper abdominal/lower chest pain, fluids ordered as well.   On re-assessment patient is feeing improved some, she would like to go home. Unclear definitive etiology-suspect multifactorial presentation, possibly MSK/tension with shoulder/head/neck discomfort- discharge home with lidoderm patches for this, possible GI related lower chest/upper abdominal discomfort- will discharge home with carafate/zofran for this. Patient has appeared hemodynamically stable throughout ER visit, she is tolerating PO, and appears safe for discharge with close PCP/cardiology follow up. I discussed results, treatment plan, need for PCP follow-up, and return precautions with the patient. Provided opportunity for questions, patient confirmed understanding and is in agreement with plan. Findings and plan of care discussed with supervising physician Dr. Madilyn Hook who is in agreement.   Portions of this note were generated with Scientist, clinical (histocompatibility and immunogenetics). Dictation errors may occur  despite best attempts at proofreading.  Final Clinical Impression(s) / ED Diagnoses Final diagnoses:  Chest pain, unspecified type    Rx / DC Orders ED Discharge Orders         Ordered    lidocaine (LIDODERM) 5 %  Daily PRN        10/10/19 1910    ondansetron (ZOFRAN ODT) 4 MG disintegrating tablet  Every 8 hours PRN        10/10/19 1910    sucralfate (CARAFATE) 1 GM/10ML suspension  3 times daily with meals & bedtime        10/10/19 1910           Desmond Lope 10/10/19 1918    Tilden Fossa, MD 10/10/19 2358

## 2019-10-10 NOTE — ED Provider Notes (Signed)
[facility]    CSN: [id number] Arrival date & time: 10/10/19  0839      History   Chief Complaint Chief Complaint  Patient presents with  . Chest Pain    HPI Carmen Chaney is a 49 y.o. female  Presenting for left-sided chest pain that radiates into left shoulder and through to back.  States that she had some nausea and vomiting with this "I vomited several times ".  No projectile vomiting, biliary or bloody emesis.  States this began last night.  Has been under significant stress lately: Denies SI/HI.  States her mother "had her first heart attack at age 106 ".  Patient denies known history of hyperlipidemia, hypertension.  Patient states she took her blood pressure at home and it was 200/99.  History reviewed. No pertinent past medical history.  Patient Active Problem List   Diagnosis Date Noted  . Contraceptive management 10/09/2015  . Seasonal and perennial allergic rhinitis 10/06/2015  . Legally blind in left eye, as defined in [place] 10/06/2015  . BMI 28.0-28.9,adult 10/06/2015  . Elevated blood pressure (not hypertension) 10/06/2015  . Former Smoker- 15 pack yr 06/18/2012    Past Surgical History:  Procedure Laterality Date  . CHOLECYSTECTOMY  2010    OB History    Gravida  2   Para      Term  0   Preterm      AB  2   Living  0     SAB  2   TAB      Ectopic      Multiple      Live Births               Home Medications    Prior to Admission medications   Medication Sig Start Date End Date Taking? Authorizing Provider  cetirizine (ZYRTEC) 10 MG tablet Take 10 mg by mouth at bedtime.    [provider]  fluticasone (FLONASE) 50 MCG/ACT nasal spray Place 2 sprays into both nostrils daily. Patient not taking: Reported on 01/28/2017 09/29/13   Jonita Albee, MD  Ibuprofen (ADVIL MIGRAINE) 200 MG CAPS Take 2 capsules by mouth daily as needed (migraine).    [provider]  Multiple Vitamin (MULTIVITAMIN) tablet Take 1  tablet by mouth daily.    [provider]  phentermine (ADIPEX-P) 37.5 MG tablet  12/08/16   [provider]  predniSONE (DELTASONE) 10 MG tablet Take 2 tablets (20 mg total) by mouth daily. Patient not taking: Reported on 10/10/2019 01/28/17   Bethann Berkshire, MD  traMADol (ULTRAM) 50 MG tablet Take 1 tablet (50 mg total) by mouth every 6 (six) hours as needed. Patient not taking: Reported on 10/10/2019 01/28/17   Bethann Berkshire, MD    Family History Family History  Problem Relation Age of Onset  . Breast cancer Mother   . Hypertension Mother   . Heart attack Mother   . Hyperlipidemia Mother   . Prostate cancer Father   . Depression Father   . Hypertension Brother   . Hypertension Maternal Grandmother   . Stroke Maternal Grandmother   . Hypertension Maternal Grandfather   . Breast cancer Paternal Grandmother   . Diabetes Paternal Grandmother     Social History Social History   Tobacco Use  . Smoking status: Former Smoker    Packs/day: 1.00    Years: 15.00    Pack years: 15.00    Types: Cigarettes    Quit date: 01/29/2014  Years since quitting: 5.6  . Smokeless tobacco: Never Used  Substance Use Topics  . Alcohol use: No  . Drug use: No     Allergies   Dilaudid [hydromorphone hcl]   Review of Systems As per HPI   Physical Exam Triage Vital Signs ED Triage Vitals [10/10/19 0840]  Enc Vitals Group     BP (!) 142/76     Pulse Rate (!) 46     Resp 16     Temp 98.1 F (36.7 C)     Temp Source Oral     SpO2 96 %     Weight      Height      Head Circumference      Peak Flow      Pain Score      Pain Loc      Pain Edu?      Excl. in GC?    No data found.  Updated Vital Signs BP (!) 142/76 (BP Location: Right Arm)   Pulse (!) 46   Temp 98.1 F (36.7 C) (Oral)   Resp 16   SpO2 96%   Visual Acuity Right Eye Distance:   Left Eye Distance:   Bilateral Distance:    Right Eye Near:   Left Eye Near:    Bilateral Near:      Physical Exam   UC Treatments / Results  Labs (all labs ordered are listed, but only abnormal results are displayed) Labs Reviewed - No data to display  EKG   Radiology No results found.  Procedures Procedures (including critical care time)  Medications Ordered in UC Medications - No data to display  Initial Impression / Assessment and Plan / UC Course  I have reviewed the triage vital signs and the nursing notes.  Pertinent labs & imaging results that were available during my care of the patient were reviewed by me and considered in my medical decision making (see chart for details).     Patient nontoxic, no acute distress.  Patient does have mildly elevated blood pressure with sinus bradycardia.  EKG done office, reviewed by me compared to previous from 01/27/2017: Sinus bradycardia with ventricular rate 46 bpm.  No QTC prolongation.  Patient does have ST and T wave morphologies in anterolateral leads.  Given patient's age, risk factors heart score puts patient at moderate risk of major cardiac events.  Recommended patient go to ER for further evaluation.  Electing to self transport in stable condition and personal vehicle.   Final Clinical Impressions(s) / UC Diagnoses   Final diagnoses:  Chest pain, unspecified type  Nonspecific abnormal electrocardiogram (ECG) (EKG)  Nausea and vomiting, intractability of vomiting not specified, unspecified vomiting type  Acute pain of left shoulder  Family history of cardiac arrest   Discharge Instructions   None    ED Prescriptions    None     PDMP not reviewed this encounter.   Hall-Potvin, [place], [place] 10/10/19 0932

## 2019-10-12 ENCOUNTER — Telehealth: Payer: Self-pay | Admitting: *Deleted

## 2019-10-12 NOTE — Telephone Encounter (Signed)
Pharmacy called related to Rx: sucralfate (CARAFATE) 1 GM/10ML suspension not being covered by insurance but tablet form was .Marland KitchenMarland KitchenEDCM clarified with EDP to change Rx to: tablet.

## 2019-10-20 ENCOUNTER — Telehealth: Payer: Self-pay

## 2019-10-20 NOTE — Telephone Encounter (Signed)
NOTES ON FILE FROM  [facility] [phone] SENT REFERRAL TO SCHEDULING °

## 2019-11-17 ENCOUNTER — Ambulatory Visit: Payer: PRIVATE HEALTH INSURANCE | Admitting: Internal Medicine

## 2019-11-25 ENCOUNTER — Ambulatory Visit: Payer: PRIVATE HEALTH INSURANCE | Admitting: Internal Medicine

## 2019-11-27 ENCOUNTER — Ambulatory Visit: Payer: PRIVATE HEALTH INSURANCE | Admitting: Internal Medicine

## 2019-12-10 ENCOUNTER — Ambulatory Visit: Payer: PRIVATE HEALTH INSURANCE | Admitting: Cardiology

## 2020-03-25 ENCOUNTER — Other Ambulatory Visit: Payer: Self-pay | Admitting: Oncology

## 2020-03-28 ENCOUNTER — Telehealth: Payer: Self-pay | Admitting: Oncology

## 2020-03-28 NOTE — Telephone Encounter (Signed)
Received an email from Dr. Darnelle Catalan ot schedule an appt for the [facility] breast [facility]. I cld and lft the pt a vm to call back to schedule.

## 2022-04-14 IMAGING — DX DG CHEST 2V
2 series · 2 of 2 positions shown · non-contrast
Comparison: January 28, 2017

CLINICAL DATA: Chest pain

EXAM:
CHEST - 2 VIEW

[chest pa]
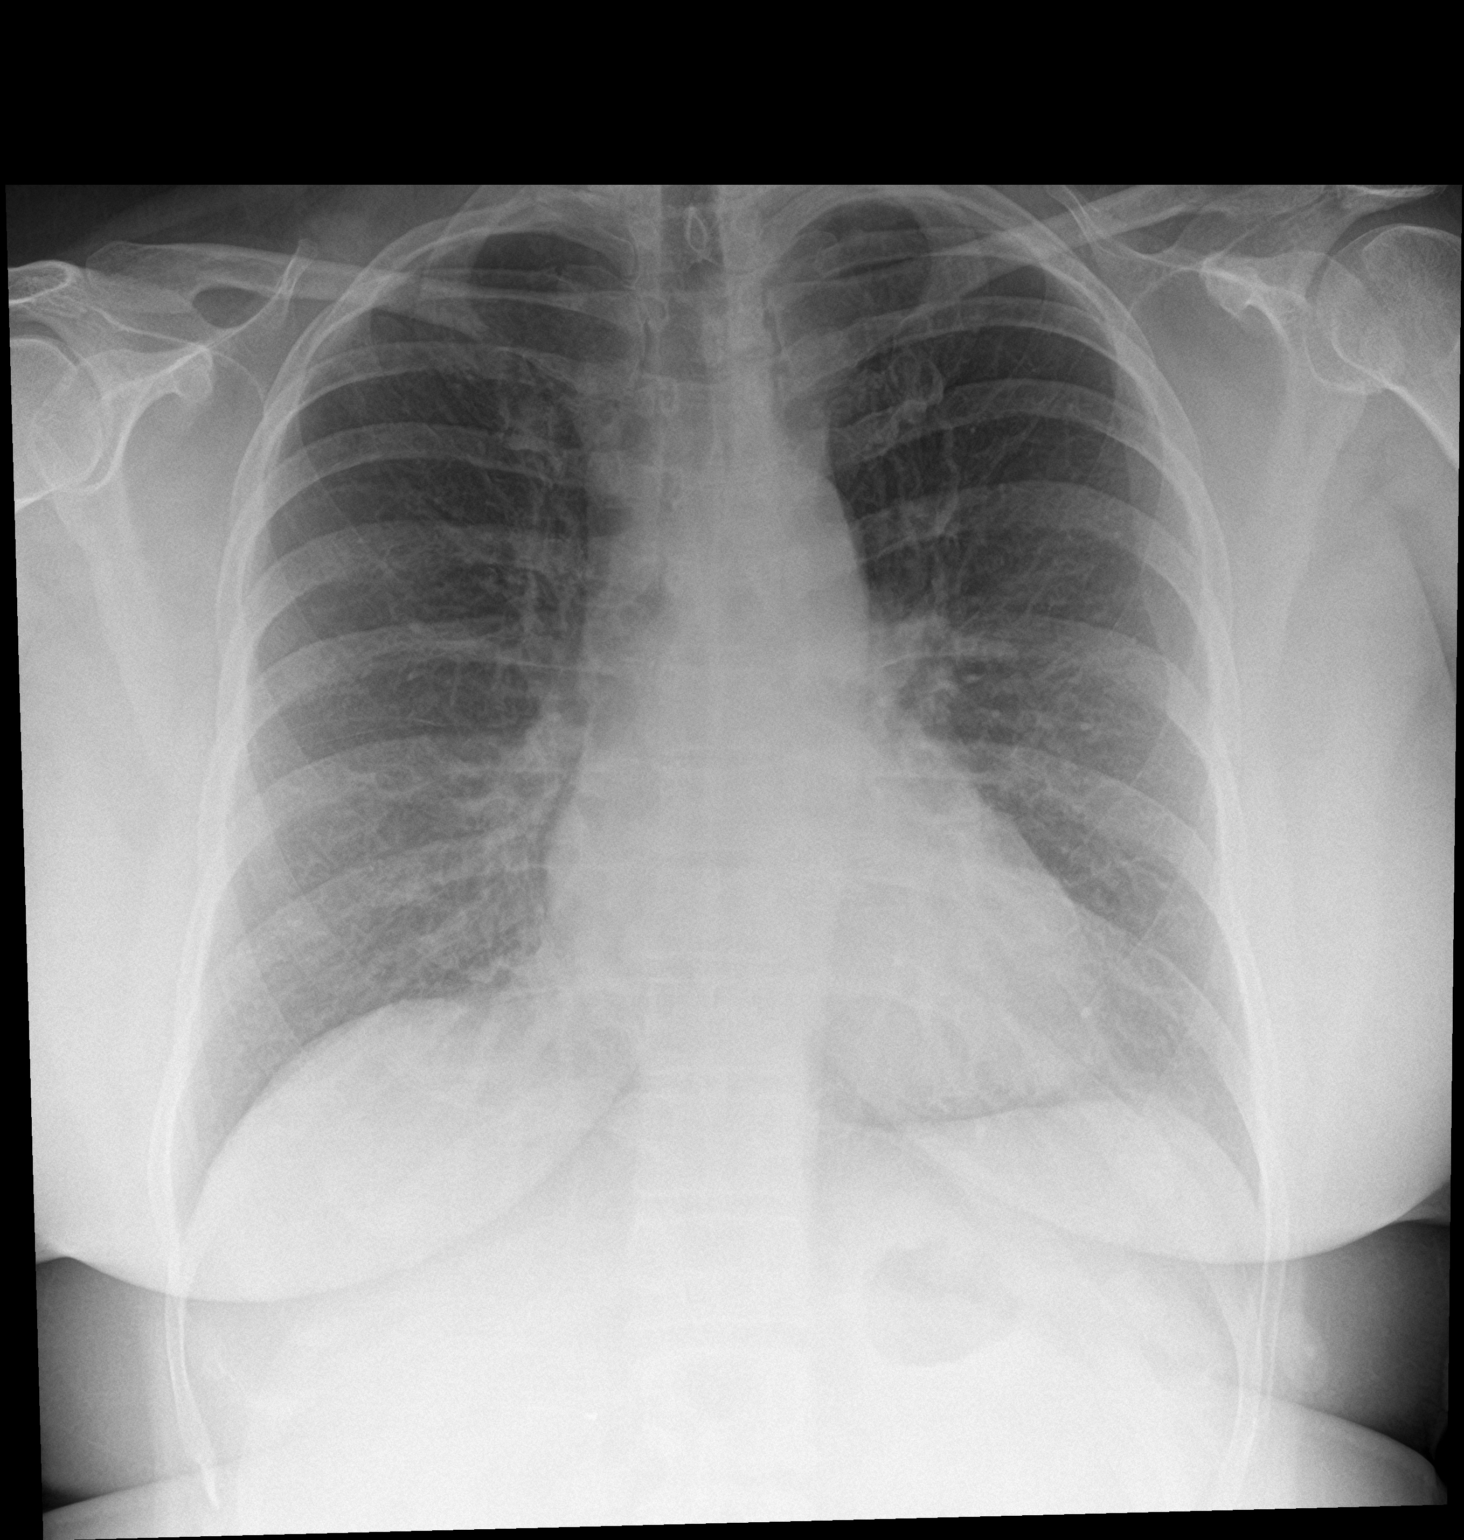

[chest lat]
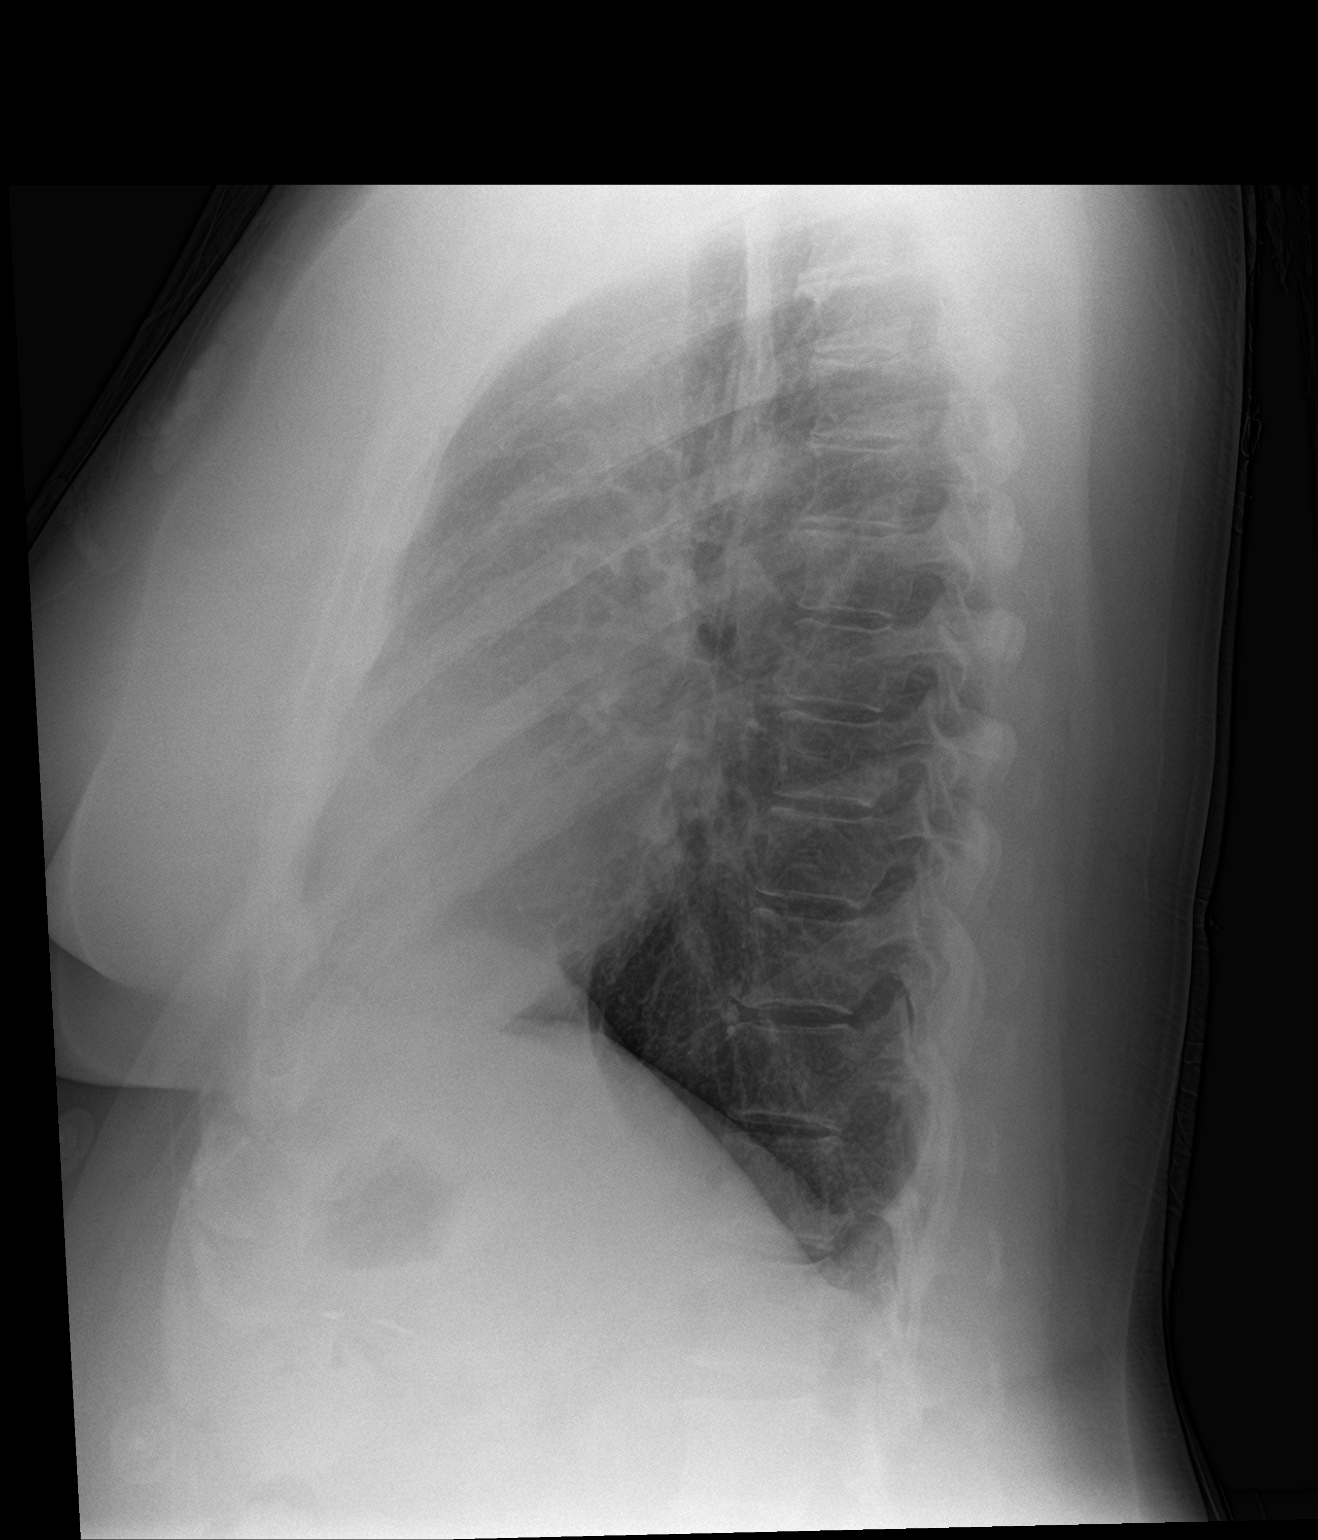

[2 of 2 positions shown; findings below may reference images not displayed]

FINDINGS: Lungs are clear. Heart size and pulmonary vascularity are normal. No
adenopathy. No pneumothorax. No bone lesions.
IMPRESSION: Lungs clear.  Cardiac silhouette normal.

## 2022-11-28 ENCOUNTER — Other Ambulatory Visit: Payer: Self-pay | Admitting: Gastroenterology

## 2022-12-26 NOTE — Progress Notes (Signed)
Anesthesia Review:  PCP: Cardiologist : Chest x-ray : EKG : Echo : Stress test: Cardiac Cath :  Activity level:  Sleep Study/ CPAP : Fasting Blood Sugar :      / Checks Blood Sugar -- times a day:   Blood Thinner/ Instructions /Last Dose: ASA / Instructions/ Last Dose :  

## 2023-01-04 ENCOUNTER — Ambulatory Visit (HOSPITAL_COMMUNITY)
Admission: RE | Admit: 2023-01-04 | Payer: Managed Care, Other (non HMO) | Source: Home / Self Care | Admitting: Gastroenterology

## 2023-01-04 ENCOUNTER — Encounter (HOSPITAL_COMMUNITY): Admission: RE | Payer: Self-pay | Source: Home / Self Care

## 2023-01-04 SURGERY — COLONOSCOPY WITH PROPOFOL
Anesthesia: Monitor Anesthesia Care
